# Patient Record
Sex: Female | Born: 1950
Health system: Southern US, Community
[De-identification: ages and names within clinical notes are randomized; demographics above are authoritative.]

## PROBLEM LIST (undated history)

## (undated) DIAGNOSIS — F419 Anxiety disorder, unspecified: Secondary | ICD-10-CM

## (undated) DIAGNOSIS — F439 Reaction to severe stress, unspecified: Secondary | ICD-10-CM

## (undated) DIAGNOSIS — M1990 Unspecified osteoarthritis, unspecified site: Secondary | ICD-10-CM

## (undated) DIAGNOSIS — U071 COVID-19: Secondary | ICD-10-CM

## (undated) DIAGNOSIS — E559 Vitamin D deficiency, unspecified: Secondary | ICD-10-CM

## (undated) DIAGNOSIS — R519 Headache, unspecified: Secondary | ICD-10-CM

## (undated) DIAGNOSIS — E538 Deficiency of other specified B group vitamins: Secondary | ICD-10-CM

## (undated) DIAGNOSIS — K602 Anal fissure, unspecified: Secondary | ICD-10-CM

## (undated) DIAGNOSIS — M7060 Trochanteric bursitis, unspecified hip: Secondary | ICD-10-CM

## (undated) DIAGNOSIS — J309 Allergic rhinitis, unspecified: Secondary | ICD-10-CM

## (undated) DIAGNOSIS — R51 Headache: Secondary | ICD-10-CM

## (undated) DIAGNOSIS — E785 Hyperlipidemia, unspecified: Secondary | ICD-10-CM

## (undated) HISTORY — DX: Anal fissure, unspecified: K60.2

## (undated) HISTORY — PX: ANAL FISSURE REPAIR: SHX2312

## (undated) HISTORY — DX: Hyperlipidemia, unspecified: E78.5

## (undated) HISTORY — PX: CATARACT EXTRACTION: SUR2

## (undated) HISTORY — DX: COVID-19: U07.1

## (undated) HISTORY — DX: Allergic rhinitis, unspecified: J30.9

## (undated) HISTORY — DX: Reaction to severe stress, unspecified: F43.9

## (undated) HISTORY — PX: JOINT REPLACEMENT: SHX530

## (undated) HISTORY — DX: Deficiency of other specified B group vitamins: E53.8

## (undated) HISTORY — DX: Trochanteric bursitis, unspecified hip: M70.60

## (undated) HISTORY — DX: Vitamin D deficiency, unspecified: E55.9

## (undated) HISTORY — PX: CERVICAL CERCLAGE: SHX1329

---

## 2011-09-03 LAB — HM HEPATITIS C SCREENING LAB: HM Hepatitis Screen: NEGATIVE

## 2014-08-12 ENCOUNTER — Ambulatory Visit: Payer: Self-pay | Admitting: General Practice

## 2014-08-12 LAB — BASIC METABOLIC PANEL
Anion Gap: 8 (ref 7–16)
BUN: 22 mg/dL — ABNORMAL HIGH
CALCIUM: 10.1 mg/dL
Chloride: 105 mmol/L
Co2: 27 mmol/L
Creatinine: 0.73 mg/dL
EGFR (African American): >60
EGFR (Non-African Amer.): >60
Glucose: 88 mg/dL
Potassium: 4.3 mmol/L
SODIUM: 140 mmol/L

## 2014-08-12 LAB — MRSA PCR SCREENING

## 2014-08-12 LAB — URINALYSIS, COMPLETE
Bacteria: NONE SEEN
Bilirubin,UR: NEGATIVE
Blood: NEGATIVE
Glucose,UR: NEGATIVE mg/dL (ref 0–75)
Ketone: NEGATIVE
LEUKOCYTE ESTERASE: NEGATIVE
NITRITE: NEGATIVE
PROTEIN: NEGATIVE
Ph: 6 (ref 4.5–8.0)
RBC, UR: NONE SEEN /HPF (ref 0–5)
Specific Gravity: 1.003 (ref 1.003–1.030)
Squamous Epithelial: NONE SEEN

## 2014-08-12 LAB — CBC
HCT: 39.2 % (ref 35.0–47.0)
HGB: 13.1 g/dL (ref 12.0–16.0)
MCH: 28.9 pg (ref 26.0–34.0)
MCHC: 33.4 g/dL (ref 32.0–36.0)
MCV: 87 fL (ref 80–100)
Platelet: 242 10*3/uL (ref 150–440)
RBC: 4.52 10*6/uL (ref 3.80–5.20)
RDW: 13.9 % (ref 11.5–14.5)
WBC: 8.1 10*3/uL (ref 3.6–11.0)

## 2014-08-12 LAB — APTT: Activated PTT: 27 secs (ref 23.6–35.9)

## 2014-08-12 LAB — PROTIME-INR
INR: 1
Prothrombin Time: 13.1 secs

## 2014-08-12 LAB — SEDIMENTATION RATE: Erythrocyte Sed Rate: 9 mm/hr (ref 0–30)

## 2014-08-13 LAB — URINE CULTURE

## 2014-08-24 ENCOUNTER — Inpatient Hospital Stay
Admit: 2014-08-24 | Disposition: A | Discharge status: Home / Self Care | Payer: Self-pay | Attending: General Practice | Admitting: General Practice

## 2014-08-24 HISTORY — PX: TOTAL HIP ARTHROPLASTY: SHX124

## 2014-08-25 LAB — BASIC METABOLIC PANEL
Anion Gap: 3 — ABNORMAL LOW (ref 7–16)
BUN: 8 mg/dL
CO2: 26 mmol/L
CREATININE: 0.6 mg/dL
Calcium, Total: 8.5 mg/dL — ABNORMAL LOW
Chloride: 104 mmol/L
EGFR (African American): >60
GLUCOSE: 131 mg/dL — AB
Potassium: 3.9 mmol/L
Sodium: 133 mmol/L — ABNORMAL LOW

## 2014-08-25 LAB — HEMOGLOBIN: HGB: 10.3 g/dL — AB (ref 12.0–16.0)

## 2014-08-25 LAB — PLATELET COUNT: Platelet: 207 10*3/uL (ref 150–440)

## 2014-08-26 LAB — BASIC METABOLIC PANEL
Anion Gap: 5 — ABNORMAL LOW (ref 7–16)
BUN: 7 mg/dL
Calcium, Total: 8.9 mg/dL
Chloride: 100 mmol/L — ABNORMAL LOW
Co2: 28 mmol/L
Creatinine: 0.63 mg/dL
EGFR (Non-African Amer.): >60
GLUCOSE: 108 mg/dL — AB
Potassium: 3.6 mmol/L
SODIUM: 133 mmol/L — AB

## 2014-08-26 LAB — PLATELET COUNT: Platelet: 193 10*3/uL (ref 150–440)

## 2014-08-26 LAB — HEMOGLOBIN: HGB: 9.8 g/dL — AB (ref 12.0–16.0)

## 2014-09-07 LAB — SURGICAL PATHOLOGY

## 2014-09-13 NOTE — Op Note (Signed)
PATIENT NAME:  Shannon [name], Shannon [name] MR#:  [id number] DATE OF BIRTH:  [birth year]  DATE OF PROCEDURE:  08/24/2014  PREOPERATIVE DIAGNOSIS: Degenerative arthrosis of the right hip (primary).   POSTOPERATIVE DIAGNOSIS: Degenerative arthrosis of the right hip (primary).   PROCEDURE PERFORMED: Right total hip arthroplasty.   SURGEON: Laurice Record. Holley Bouche., M.D.    ASSISTANT: Vance Peper, PA (required to maintain retraction throughout the procedure).   ANESTHESIA: Spinal.   ESTIMATED BLOOD LOSS: 250 mL.   FLUIDS REPLACED: 1600 mL of crystalloid.   DRAINS: Two medium drains to Hemovac reservoir.   IMPLANTS UTILIZED: DePuy 13.5 mm small stature AML femoral stem, 52 mm outer diameter Pinnacle 100 acetabular component, +4 mm neutral Pinnacle Marathon polyethylene liner, and a 36 mm M-SPEC femoral head with a +5 mm neck length.   INDICATIONS FOR SURGERY: The patient is a 64 year old female, who has been seen for complaints of progressive right hip and groin pain. X-rays demonstrated severe degenerative changes. After discussion of the risks and benefits of surgical intervention, the patient expressed understanding of the risks and benefits, and agreed with plans for surgical intervention.   PROCEDURE IN DETAIL: The patient was brought to the operating room, and after adequate spinal anesthesia was achieved, the patient was placed in a left lateral decubitus position. An extra roll was placed, and all bony prominences were well padded. The patient's right hip and leg were cleaned and prepped with alcohol and DuraPrep draped in the usual sterile fashion. A "timeout" was performed as per usual protocol. A lateral curvilinear incision was made gently curving towards the posterior superior iliac spine. The IT band was incised in line with the skin incision. Fibers of the gluteus maximus were split in line. The piriformis tendon was identified, skeletonized, incised at its insertion at the proximal femur and  reflected posteriorly. In a similar fashion, the short external rotators were incised and reflected posteriorly. A T-type posterior capsulotomy was performed. Prior to dislocation of the femoral head, a threaded Steinmann pin was inserted through a separate stab incision into the pelvis superior to the acetabulum and bent in the form of a stylus so as to assess limb length and hip offset throughout the procedure. The femoral head was then dislocated posteriorly. Inspection of the femoral head demonstrated severe degenerative changes with full-thickness loss of articular cartilage. The femoral neck cut was performed using an oscillating saw. The anterior capsule was elevated off the femoral neck. Inspection of the acetabulum also demonstrated significant degenerative changes. The labrum was excised using electrocautery. The acetabulum was reamed in a sequential fashion up to a 51 mm diameter. Good punctate bleeding bone was encountered. A 52 mm outer diameter Pinnacle 100 acetabular component was positioned and impacted into place. Good scratch fit was appreciated. Some anterior osteophytes were debrided using a combination of osteotome and a rongeur. A +4 mm neutral polyethylene trial was inserted and attention was directed to the proximal femur.   A pilot hole for reaming of the femoral canal was created using a high-speed bur. The proximal femoral canal was reamed in a sequential fashion up to a 13 mm diameter. This allowed for approximately 6 cm of scratch fit. A 13.5 mm aggressive side-biting reamer was then used to prepare the proximal femur. Serial broaches were inserted up to a 13.5 mm small stature broach. The calcar region was planed and trial reduction was performed using first a +1.5 mm neck length, and subsequently a +5 mm neck length. This allowed for  good equalization of limb lengths and hip offset, and excellent stability both anteriorly and posteriorly. Trial components were removed. The acetabular  shell was irrigated with copious amounts of normal saline with antibiotic solution and then suctioned dry. A +4 mm Pinnacle Marathon polyethylene insert was positioned and impacted into place. Next, a 13.5 mm AML small stature stem was positioned and impacted into place. Excellent scratch fit was appreciated. Trial reduction was performed using a 36 mm hip ball with a +5 mm neck length. This allowed for good equalization of limb lengths and hip offset and excellent stability both anteriorly and posteriorly. The trial hip ball was removed. The [facility] taper was cleaned and dried and a 36 mm outer diameter M-SPEC femoral head with a +5 mm neck length was placed on the trunnion and impacted into place. The hip was reduced and placed through a range of motion. Again, excellent stability both anteriorly and posteriorly was noted and good equalization of limb lengths and appropriate hip offset was noted.   The wound was irrigated with copious amounts of normal saline with antibiotic solution using pulsatile lavage and suctioned dry. Good hemostasis was appreciated. The posterior capsulotomy was repaired using #5 Ethibond. The piriformis tendon was reapproximated on the surface of the gluteus medius tendon using #5 Ethibond. Two medium drains were placed in the wound bed and brought out through a separate stab incision to be attached to a Hemovac reservoir. IT band was repaired using interrupted sutures of #1 Vicryl. The subcutaneous tissue was approximated in layers using first #0 Vicryl followed by #2-0 Vicryl. The skin was closed with skin staples. A sterile dressing was applied.   The patient tolerated the procedure well. She was transported to the recovery room in stable condition.    ____________________________ Laurice Record. Holley Bouche., MD jph:JT D: 08/24/2014 10:27:36 ET T: 08/24/2014 11:03:13 ET JOB#: [id number]  cc: Jeneen Rinks P. Holley Bouche., MD, <Dictator> JAMES P Holley Bouche MD ELECTRONICALLY SIGNED 09/10/2014  7:08

## 2014-09-13 NOTE — Discharge Summary (Signed)
PATIENT NAME:  Shannon [name], Shannon [name] MR#:  [id number] DATE OF BIRTH:  1951/04/06  DATE OF ADMISSION:  08/24/2014 DATE OF DISCHARGE:  08/27/2014  DICTATING FOR: Jeneen Rinks P. Holley Bouche., MD  ADMITTING DIAGNOSIS: Degenerative arthrosis of the right hip.   DISCHARGE DIAGNOSIS: Degenerative arthrosis of the right hip.   HISTORY:  The patient is a 64 year old female who has been followed at [facility] for progression of right hip and groin pain. She reported approximately a 4 to 5 year history of progressive hip pain. She had used nonsteroidal anti-inflammatory medication with only modest improvement in her symptoms. She has also completed a course of physical therapy, but still had severe right hip and groin pain. She was noted to have decrease in her right hip range of motion. She occasionally had sensation of the right hip giving way. At the time of surgery, she was not using any ambulatory aid on a regular basis. The patient states that the right hip pain had progressed to the point that it was significantly interfering with her activities of daily living. X-rays taken in [facility] showed significant narrowing of the cartilage space with flattening of the superior aspect of the femoral head with bone-on-bone articulation. There was subchondral sclerosis as well as osteophyte formation noted. After discussion of the risks and benefits of surgical intervention, the patient expressed her understanding of the risks and benefits and agreed for plans for surgical intervention.   PROCEDURE: Right total hip arthroplasty.   ANESTHESIA: Spinal.   IMPLANTS UTILIZED:  DePuy 13.5 mm small stature AML femoral stem, 52 mm outer diameter Pinnacle 100 acetabular component, +4 neutral Pinnacle Marathon polyethylene liner, and a 36 mm M-SPEC femoral head with a + 5 mm neck length.   HOSPITAL COURSE: The patient tolerated the procedure very well. She had no complications. She was then taken to the PACU where she was  stabilized and then transferred to the orthopedic floor. She began receiving anticoagulation therapy of Lovenox 30 mg subcutaneous every 12 hours per anesthesia and pharmacy protocol. She was fitted with TED stockings bilaterally. These were allowed to be removed 1 hour per 8 hour shift. The patient was also fitted with the AVI compression foot pumps bilaterally set at 80 mmHg. Her calves have been nontender. She has had no evidence of any DVTs. Negative Homans sign. Heels were elevated off the bed using rolled towels.   The patient's vital signs have been stable. She has been afebrile. Hemodynamically she was stable. No transfusions were given. She is denying any chest pain or shortness of breath.   Physical therapy was initiated on day 1 for gait training and transfers. She has done extremely well. Upon being discharged, was ambulating greater than 350 feet. She was able go up 4 steps. She was independent with bed to chair transfers safely. She also could recall her hip precautions.   The patient's IV, Foley and Hemovac were discontinued on day 2, the Hemovac was also discontinued. The wound was free of any drainage or signs of infection.   DISPOSITION: The patient is discharged to home in improved stable condition.   DISCHARGE INSTRUCTIONS: She may continue weight-bearing as tolerated. Continue using a rolling walker until cleared by physical therapy to go to a quad cane. She will receive home health PT. She was instructed in elevation of the lower extremity. Continue TED stockings bilaterally. These are to be worn during the day, but may be removed at night. I recommend she continue with incentive spirometer  q. 1 hour while awake. I also encouraged cough, deep breathing q. 2 hours while awake. She is placed on a regular diet. She was instructed on wound care. Staples will be removed on 92/92/4462 with the application of benzoin and Steri-Strips. She has a followup appointment with [facility] on  10/06/2014 at 9:15. She is to call the clinic sooner if any temperatures of 101.5 or greater, excessive bleeding, or any other complications.   The patient may resume her regular medication that she was on prior to admission. She was given a prescription for Lovenox 40 mg subcutaneously daily for 14 days, then discontinue and begin taking one 81 mg enteric-coated aspirin. Also a prescription for Roxicodone 5 to 10 mg q. 4-6 hours p.r.n. for pain and tramadol 50 to 100 mg q. 4-6 hours p.r.n. for pain.   PAST MEDICAL HISTORY:  Depression and acid reflux.   ____________________________ Vance Peper, PA [phone] D: 08/27/2014 06:34:48 ET T: 08/27/2014 07:56:53 ET JOB#: [id number]  cc: Vance Peper, PA, <Dictator> Maryssa Giampietro PA ELECTRONICALLY SIGNED 08/27/2014 22:33

## 2015-01-20 ENCOUNTER — Other Ambulatory Visit: Payer: Self-pay | Admitting: Family Medicine

## 2015-01-20 DIAGNOSIS — Z1231 Encounter for screening mammogram for malignant neoplasm of breast: Secondary | ICD-10-CM

## 2015-01-26 ENCOUNTER — Ambulatory Visit
Admission: RE | Admit: 2015-01-26 | Discharge: 2015-01-26 | Disposition: A | Discharge status: Home / Self Care | Payer: No Typology Code available for payment source | Source: Ambulatory Visit | Attending: Family Medicine | Admitting: Family Medicine

## 2015-01-26 DIAGNOSIS — Z1231 Encounter for screening mammogram for malignant neoplasm of breast: Secondary | ICD-10-CM | POA: Diagnosis not present

## 2015-07-29 ENCOUNTER — Emergency Department (HOSPITAL_COMMUNITY)
Admission: EM | Admit: 2015-07-29 | Discharge: 2015-07-29 | Disposition: A | Discharge status: Home / Self Care | Payer: Self-pay | Attending: Emergency Medicine | Admitting: Emergency Medicine

## 2015-07-29 ENCOUNTER — Encounter (HOSPITAL_COMMUNITY): Payer: Self-pay

## 2015-07-29 ENCOUNTER — Emergency Department (HOSPITAL_COMMUNITY): Payer: Self-pay

## 2015-07-29 DIAGNOSIS — Y9389 Activity, other specified: Secondary | ICD-10-CM | POA: Insufficient documentation

## 2015-07-29 DIAGNOSIS — R4182 Altered mental status, unspecified: Secondary | ICD-10-CM | POA: Insufficient documentation

## 2015-07-29 DIAGNOSIS — Z8739 Personal history of other diseases of the musculoskeletal system and connective tissue: Secondary | ICD-10-CM | POA: Insufficient documentation

## 2015-07-29 DIAGNOSIS — Y998 Other external cause status: Secondary | ICD-10-CM | POA: Insufficient documentation

## 2015-07-29 DIAGNOSIS — Z87891 Personal history of nicotine dependence: Secondary | ICD-10-CM | POA: Insufficient documentation

## 2015-07-29 DIAGNOSIS — W010XXA Fall on same level from slipping, tripping and stumbling without subsequent striking against object, initial encounter: Secondary | ICD-10-CM | POA: Insufficient documentation

## 2015-07-29 DIAGNOSIS — S0083XA Contusion of other part of head, initial encounter: Secondary | ICD-10-CM | POA: Insufficient documentation

## 2015-07-29 DIAGNOSIS — S060X1A Concussion with loss of consciousness of 30 minutes or less, initial encounter: Secondary | ICD-10-CM | POA: Insufficient documentation

## 2015-07-29 DIAGNOSIS — Y92007 Garden or yard of unspecified non-institutional (private) residence as the place of occurrence of the external cause: Secondary | ICD-10-CM | POA: Insufficient documentation

## 2015-07-29 LAB — CBC WITH DIFFERENTIAL/PLATELET
Basophils Absolute: 0 10*3/uL (ref 0.0–0.1)
Basophils Relative: 0 %
Eosinophils Absolute: 0.2 10*3/uL (ref 0.0–0.7)
Eosinophils Relative: 2 %
HEMATOCRIT: 38.9 % (ref 36.0–46.0)
HEMOGLOBIN: 13.2 g/dL (ref 12.0–15.0)
LYMPHS PCT: 22 %
Lymphs Abs: 1.8 10*3/uL (ref 0.7–4.0)
MCH: 28.5 pg (ref 26.0–34.0)
MCHC: 33.9 g/dL (ref 30.0–36.0)
MCV: 84 fL (ref 78.0–100.0)
MONOS PCT: 4 %
Monocytes Absolute: 0.3 10*3/uL (ref 0.1–1.0)
NEUTROS ABS: 5.7 10*3/uL (ref 1.7–7.7)
NEUTROS PCT: 72 %
Platelets: 261 10*3/uL (ref 150–400)
RBC: 4.63 MIL/uL (ref 3.87–5.11)
RDW: 14 % (ref 11.5–15.5)
WBC: 8 10*3/uL (ref 4.0–10.5)

## 2015-07-29 LAB — COMPREHENSIVE METABOLIC PANEL
ALK PHOS: 79 U/L (ref 38–126)
ALT: 18 U/L (ref 14–54)
ANION GAP: 10 (ref 5–15)
AST: 24 U/L (ref 15–41)
Albumin: 4.1 g/dL (ref 3.5–5.0)
BILIRUBIN TOTAL: 0.5 mg/dL (ref 0.3–1.2)
BUN: 14 mg/dL (ref 6–20)
CALCIUM: 10.6 mg/dL — AB (ref 8.9–10.3)
CO2: 28 mmol/L (ref 22–32)
Chloride: 104 mmol/L (ref 101–111)
Creatinine, Ser: 0.78 mg/dL (ref 0.44–1.00)
Glucose, Bld: 109 mg/dL — ABNORMAL HIGH (ref 65–99)
Potassium: 5.1 mmol/L (ref 3.5–5.1)
Sodium: 142 mmol/L (ref 135–145)
TOTAL PROTEIN: 6.9 g/dL (ref 6.5–8.1)

## 2015-07-29 MED ORDER — ACETAMINOPHEN 325 MG PO TABS
650.0000 mg | ORAL_TABLET | Freq: Once | ORAL | Status: AC
  Administered 2015-07-29: 650 mg via ORAL
  Filled 2015-07-29: qty 2

## 2015-07-29 NOTE — Discharge Instructions (Signed)
Concussion, Adult  A concussion, or closed-head injury, is a brain injury caused by a direct blow to the head or by a quick and sudden movement (jolt) of the head or neck. Concussions are usually not life-threatening. Even so, the effects of a concussion can be serious. If you have had a concussion before, you are more likely to experience concussion-like symptoms after a direct blow to the head.   CAUSES  · Direct blow to the head, such as from running into another player during a soccer game, being hit in a fight, or hitting your head on a hard surface.  · A jolt of the head or neck that causes the brain to move back and forth inside the skull, such as in a car crash.  SIGNS AND SYMPTOMS  The signs of a concussion can be hard to notice. Early on, they may be missed by you, family members, and health care providers. You may look fine but act or feel differently.  Symptoms are usually temporary, but they may last for days, weeks, or even longer. Some symptoms may appear right away while others may not show up for hours or days. Every head injury is different. Symptoms include:  · Mild to moderate headaches that will not go away.  · A feeling of pressure inside your head.  · Having more trouble than usual:    Learning or remembering things you have heard.    Answering questions.    Paying attention or concentrating.    Organizing daily tasks.    Making decisions and solving problems.  · Slowness in thinking, acting or reacting, speaking, or reading.  · Getting lost or being easily confused.  · Feeling tired all the time or lacking energy (fatigued).  · Feeling drowsy.  · Sleep disturbances.    Sleeping more than usual.    Sleeping less than usual.    Trouble falling asleep.    Trouble sleeping (insomnia).  · Loss of balance or feeling lightheaded or dizzy.  · Nausea or vomiting.  · Numbness or tingling.  · Increased sensitivity to:    Sounds.    Lights.    Distractions.  · Vision problems or eyes that tire  easily.  · Diminished sense of taste or smell.  · Ringing in the ears.  · Mood changes such as feeling sad or anxious.  · Becoming easily irritated or angry for little or no reason.  · Lack of motivation.  · Seeing or hearing things other people do not see or hear (hallucinations).  DIAGNOSIS  Your health care provider can usually diagnose a concussion based on a description of your injury and symptoms. He or she will ask whether you passed out (lost consciousness) and whether you are having trouble remembering events that happened right before and during your injury.  Your evaluation might include:  · A brain scan to look for signs of injury to the brain. Even if the test shows no injury, you may still have a concussion.  · Blood tests to be sure other problems are not present.  TREATMENT  · Concussions are usually treated in an emergency department, in urgent care, or at a clinic. You may need to stay in the hospital overnight for further treatment.  · Tell your health care provider if you are taking any medicines, including prescription medicines, over-the-counter medicines, and natural remedies. Some medicines, such as blood thinners (anticoagulants) and aspirin, may increase the chance of complications. Also tell your health care   provider whether you have had alcohol or are taking illegal drugs. This information may affect treatment.  · Your health care provider will send you home with important instructions to follow.  · How fast you will recover from a concussion depends on many factors. These factors include how severe your concussion is, what part of your brain was injured, your age, and how healthy you were before the concussion.  · Most people with mild injuries recover fully. Recovery can take time. In general, recovery is slower in older persons. Also, persons who have had a concussion in the past or have other medical problems may find that it takes longer to recover from their current injury.  HOME  CARE INSTRUCTIONS  General Instructions  · Carefully follow the directions your health care provider gave you.  · Only take over-the-counter or prescription medicines for pain, discomfort, or fever as directed by your health care provider.  · Take only those medicines that your health care provider has approved.  · Do not drink alcohol until your health care provider says you are well enough to do so. Alcohol and certain other drugs may slow your recovery and can put you at risk of further injury.  · If it is harder than usual to remember things, write them down.  · If you are easily distracted, try to do one thing at a time. For example, do not try to watch TV while fixing dinner.  · Talk with family members or close friends when making important decisions.  · Keep all follow-up appointments. Repeated evaluation of your symptoms is recommended for your recovery.  · Watch your symptoms and tell others to do the same. Complications sometimes occur after a concussion. Older adults with a brain injury may have a higher risk of serious complications, such as a blood clot on the brain.  · Tell your teachers, school nurse, school counselor, coach, athletic trainer, or work manager about your injury, symptoms, and restrictions. Tell them about what you can or cannot do. They should watch for:    Increased problems with attention or concentration.    Increased difficulty remembering or learning new information.    Increased time needed to complete tasks or assignments.    Increased irritability or decreased ability to cope with stress.    Increased symptoms.  · Rest. Rest helps the brain to heal. Make sure you:    Get plenty of sleep at night. Avoid staying up late at night.    Keep the same bedtime hours on weekends and weekdays.    Rest during the day. Take daytime naps or rest breaks when you feel tired.  · Limit activities that require a lot of thought or concentration. These include:    Doing homework or job-related  work.    Watching TV.    Working on the computer.  · Avoid any situation where there is potential for another head injury (football, hockey, soccer, basketball, martial arts, downhill snow sports and horseback riding). Your condition will get worse every time you experience a concussion. You should avoid these activities until you are evaluated by the appropriate follow-up health care providers.  Returning To Your Regular Activities  You will need to return to your normal activities slowly, not all at once. You must give your body and brain enough time for recovery.  · Do not return to sports or other athletic activities until your health care provider tells you it is safe to do so.  · Ask   your health care provider when you can drive, ride a bicycle, or operate heavy machinery. Your ability to react may be slower after a brain injury. Never do these activities if you are dizzy.  · Ask your health care provider about when you can return to work or school.  Preventing Another Concussion  It is very important to avoid another brain injury, especially before you have recovered. In rare cases, another injury can lead to permanent brain damage, brain swelling, or death. The risk of this is greatest during the first 7-10 days after a head injury. Avoid injuries by:  · Wearing a seat belt when riding in a car.  · Drinking alcohol only in moderation.  · Wearing a helmet when biking, skiing, skateboarding, skating, or doing similar activities.  · Avoiding activities that could lead to a second concussion, such as contact or recreational sports, until your health care provider says it is okay.  · Taking safety measures in your home.    Remove clutter and tripping hazards from floors and stairways.    Use grab bars in bathrooms and handrails by stairs.    Place non-slip mats on floors and in bathtubs.    Improve lighting in dim areas.  SEEK MEDICAL CARE IF:  · You have increased problems paying attention or  concentrating.  · You have increased difficulty remembering or learning new information.  · You need more time to complete tasks or assignments than before.  · You have increased irritability or decreased ability to cope with stress.  · You have more symptoms than before.  Seek medical care if you have any of the following symptoms for more than 2 weeks after your injury:  · Lasting (chronic) headaches.  · Dizziness or balance problems.  · Nausea.  · Vision problems.  · Increased sensitivity to noise or light.  · Depression or mood swings.  · Anxiety or irritability.  · Memory problems.  · Difficulty concentrating or paying attention.  · Sleep problems.  · Feeling tired all the time.  SEEK IMMEDIATE MEDICAL CARE IF:  · You have severe or worsening headaches. These may be a sign of a blood clot in the brain.  · You have weakness (even if only in one hand, leg, or part of the face).  · You have numbness.  · You have decreased coordination.  · You vomit repeatedly.  · You have increased sleepiness.  · One pupil is larger than the other.  · You have convulsions.  · You have slurred speech.  · You have increased confusion. This may be a sign of a blood clot in the brain.  · You have increased restlessness, agitation, or irritability.  · You are unable to recognize people or places.  · You have neck pain.  · It is difficult to wake you up.  · You have unusual behavior changes.  · You lose consciousness.  MAKE SURE YOU:  · Understand these instructions.  · Will watch your condition.  · Will get help right away if you are not doing well or get worse.     This information is not intended to replace advice given to you by your health care provider. Make sure you discuss any questions you have with your health care provider.     Document Released: 07/22/2003 Document Revised: 05/22/2014 Document Reviewed: 11/21/2012  [employer] Interactive Patient Education ©2016 [employer]

## 2015-07-29 NOTE — ED Provider Notes (Addendum)
CSN: [id number]     Arrival date & time 07/29/15  1152 History   First MD Initiated Contact with Patient 07/29/15 1154     Chief Complaint  Patient presents with  . Fall  . Altered Mental Status     (Consider location/radiation/quality/duration/timing/severity/associated sxs/prior Treatment) HPI Comments: Patient is a 65 year old female with a history of bilateral hip arthritis who presents today with loss of consciousness. Patient states the last thing she remembers is taking the phone to her boyfriend and then waking up with EMS. EMS reported patient tripped over a branch in the yard and fell face first onto the concrete. Initially patient was only oriented to self however upon arrival here she is awake alert and oriented. She is complaining of right-sided facial pain but denies any unilateral weakness, numbness, swallowing or speech difficulty. She takes no anticoagulation and denies any recent illness or medication changes.  Patient is a 65 y.o. female presenting with fall and altered mental status. The history is provided by the patient and the EMS personnel.  Fall  Altered Mental Status   History reviewed. No pertinent past medical history. Past Surgical History  Procedure Laterality Date  . Total hip arthroplasty     History reviewed. No pertinent family history. Social History  Substance Use Topics  . Smoking status: Former Research scientist (life sciences)  . Smokeless tobacco: None  . Alcohol Use: No   OB History    No data available     Review of Systems  All other systems reviewed and are negative.     Allergies  Review of patient's allergies indicates no known allergies.  Home Medications   Prior to Admission medications   Not on File   BP 145/87 mmHg  Pulse 61  Temp(Src) 97.9 F (36.6 C) (Oral)  Ht 5\' 5"  (1.651 m)  Wt 165 lb (74.844 kg)  BMI 27.46 kg/m2  SpO2 100% Physical Exam  Constitutional: She is oriented to person, place, and time. She appears well-developed and  well-nourished. No distress.  HENT:  Head: Normocephalic and atraumatic.    Mouth/Throat: Oropharynx is clear and moist.  Eyes: Conjunctivae and EOM are normal. Pupils are equal, round, and reactive to light.  Neck: Normal range of motion. Neck supple. No spinous process tenderness and no muscular tenderness present.  Cardiovascular: Normal rate, regular rhythm and intact distal pulses.   No murmur heard. Pulmonary/Chest: Effort normal and breath sounds normal. No respiratory distress. She has no wheezes. She has no rales.  Abdominal: Soft. She exhibits no distension. There is no tenderness. There is no rebound and no guarding.  Musculoskeletal: Normal range of motion. She exhibits no edema or tenderness.  Neurological: She is alert and oriented to person, place, and time. She has normal strength. No cranial nerve deficit or sensory deficit. Coordination and gait normal.  No pronator drift present  Skin: Skin is warm and dry. No rash noted. No erythema.  Psychiatric: She has a normal mood and affect. Her behavior is normal.  Nursing note and vitals reviewed.   ED Course  Procedures (including critical care time) Labs Review Labs Reviewed  COMPREHENSIVE METABOLIC PANEL - Abnormal; Notable for the following:    Glucose, Bld 109 (*)    Calcium 10.6 (*)    All other components within normal limits  CBC WITH DIFFERENTIAL/PLATELET  URINALYSIS, ROUTINE W REFLEX MICROSCOPIC (NOT AT [facility])    Imaging Review Dg Chest 2 View  07/29/2015  CLINICAL DATA:  [place] today and hit head. EXAM: CHEST  2 VIEW COMPARISON:  None. FINDINGS: The cardiac silhouette, mediastinal and hilar contours are within normal limits. Mild tortuosity of the thoracic aorta. The lungs are clear. No pleural effusion. No pneumothorax. Bilateral nipple shadows noted. The bony thorax is intact. IMPRESSION: No acute cardiopulmonary findings and intact bony thorax. Electronically Signed   By: Marijo Sanes M.D.   On: 07/29/2015  13:22   Ct Head Wo Contrast  07/29/2015  CLINICAL DATA:  Syncope. Fall. Swelling and abrasion to right face and below chin. EXAM: CT HEAD WITHOUT CONTRAST CT MAXILLOFACIAL WITHOUT CONTRAST TECHNIQUE: Multidetector CT imaging of the head and maxillofacial structures were performed using the standard protocol without intravenous contrast. Multiplanar CT image reconstructions of the maxillofacial structures were also generated. COMPARISON:  None. FINDINGS: CT HEAD FINDINGS Paranasal sinuses, mastoid air cells, and visualized bones are within normal limits. No subdural, epidural, or subarachnoid hemorrhage. No mass, mass effect, or midline shift. Ventricles and sulci are normal. No acute ischemia or infarct identified. Cerebellum, brainstem, and basal cisterns are normal. CT MAXILLOFACIAL FINDINGS There is soft tissue swelling inferior to the mandible, particularly to the right. There is also soft tissue swelling over the right cheek. Extracranial soft tissues including the orbits are otherwise normal. No fractures in the facial bones. IMPRESSION: 1. No acute intracranial process. 2. No fractures. Electronically Signed   By: Dorise Bullion III M.D   On: 07/29/2015 13:37   Ct Maxillofacial Wo Cm  07/29/2015  CLINICAL DATA:  Syncope. Fall. Swelling and abrasion to right face and below chin. EXAM: CT HEAD WITHOUT CONTRAST CT MAXILLOFACIAL WITHOUT CONTRAST TECHNIQUE: Multidetector CT imaging of the head and maxillofacial structures were performed using the standard protocol without intravenous contrast. Multiplanar CT image reconstructions of the maxillofacial structures were also generated. COMPARISON:  None. FINDINGS: CT HEAD FINDINGS Paranasal sinuses, mastoid air cells, and visualized bones are within normal limits. No subdural, epidural, or subarachnoid hemorrhage. No mass, mass effect, or midline shift. Ventricles and sulci are normal. No acute ischemia or infarct identified. Cerebellum, brainstem, and basal  cisterns are normal. CT MAXILLOFACIAL FINDINGS There is soft tissue swelling inferior to the mandible, particularly to the right. There is also soft tissue swelling over the right cheek. Extracranial soft tissues including the orbits are otherwise normal. No fractures in the facial bones. IMPRESSION: 1. No acute intracranial process. 2. No fractures. Electronically Signed   By: Dorise Bullion III M.D   On: 07/29/2015 13:37   I have personally reviewed and evaluated these images and lab results as part of my medical decision-making.   EKG Interpretation None      MDM   Final diagnoses:  Concussion, with loss of consciousness of 30 minutes or less, initial encounter  Facial contusion, initial encounter    Patient is a healthy 65 year old female who had a fall today with a syncopal event. It is unclear if patient syncopized prior to or after the fall. EMS report patient tripped over branches in the [place] fell face first onto the concrete however patient cannot remember anything. Currently her only complaint is pain over the right side of her face. No localized weakness, numbness or speech difficulty. Currently family has not present to give further history. Patient has never had a history of syncope and denies any palpitations, chest pain or shortness of breath. She takes no anticoagulation.  CT of the head, face, EKG, CBC, CMP, UA pending. Will wait for family to arrive for more information.  2:03 PM Patient's labs, EKG are  within normal limits. Head CT, facial CT and chest x-ray without acute findings. patient's boyfriend arrives he states that she tripped on abdominal wall and fell face first into the concrete.  Pt never was completely unconscious but was confused per family.  Pt does not remember this.  Symptoms she is displaying are consistent with concussion. Patient was able to ambulate without difficulty and feel that she is safe for discharge home.  Blanchie Dessert, MD 07/29/15  1404  Blanchie Dessert, MD 07/29/15 1422

## 2015-07-29 NOTE — ED Notes (Signed)
Pt tripped over branches in yard and fell face first onto concrete.  EMS reports that pt did not attempt to catch herself and fell on right side of face.  Obvious swelling and bruising noted.  Upon EMS arrival pt was only oriented to self.  Pt is A&Ox4 upon arrival and in NAD.

## 2016-07-11 ENCOUNTER — Other Ambulatory Visit: Payer: Self-pay | Admitting: Family Medicine

## 2016-07-11 DIAGNOSIS — Z1231 Encounter for screening mammogram for malignant neoplasm of breast: Secondary | ICD-10-CM

## 2016-08-08 ENCOUNTER — Ambulatory Visit
Admission: RE | Admit: 2016-08-08 | Discharge: 2016-08-08 | Disposition: A | Discharge status: Home / Self Care | Payer: BLUE CROSS/BLUE SHIELD | Source: Ambulatory Visit | Attending: Family Medicine | Admitting: Family Medicine

## 2016-08-08 DIAGNOSIS — Z1231 Encounter for screening mammogram for malignant neoplasm of breast: Secondary | ICD-10-CM | POA: Diagnosis present

## 2016-09-14 ENCOUNTER — Encounter
Admission: RE | Admit: 2016-09-14 | Discharge: 2016-09-14 | Disposition: A | Discharge status: Home / Self Care | Payer: BLUE CROSS/BLUE SHIELD | Source: Ambulatory Visit | Attending: Orthopedic Surgery | Admitting: Orthopedic Surgery

## 2016-09-14 DIAGNOSIS — Z01812 Encounter for preprocedural laboratory examination: Secondary | ICD-10-CM | POA: Insufficient documentation

## 2016-09-14 DIAGNOSIS — R51 Headache: Secondary | ICD-10-CM | POA: Insufficient documentation

## 2016-09-14 DIAGNOSIS — M1990 Unspecified osteoarthritis, unspecified site: Secondary | ICD-10-CM | POA: Diagnosis not present

## 2016-09-14 DIAGNOSIS — I517 Cardiomegaly: Secondary | ICD-10-CM | POA: Diagnosis not present

## 2016-09-14 DIAGNOSIS — Z01810 Encounter for preprocedural cardiovascular examination: Secondary | ICD-10-CM | POA: Insufficient documentation

## 2016-09-14 DIAGNOSIS — F419 Anxiety disorder, unspecified: Secondary | ICD-10-CM | POA: Diagnosis not present

## 2016-09-14 HISTORY — DX: Anxiety disorder, unspecified: F41.9

## 2016-09-14 HISTORY — DX: Headache: R51

## 2016-09-14 HISTORY — DX: Unspecified osteoarthritis, unspecified site: M19.90

## 2016-09-14 HISTORY — DX: Headache, unspecified: R51.9

## 2016-09-14 LAB — COMPREHENSIVE METABOLIC PANEL
ALK PHOS: 89 U/L (ref 38–126)
ALT: 31 U/L (ref 14–54)
AST: 30 U/L (ref 15–41)
Albumin: 4.4 g/dL (ref 3.5–5.0)
Anion gap: 7 (ref 5–15)
BILIRUBIN TOTAL: 0.5 mg/dL (ref 0.3–1.2)
BUN: 18 mg/dL (ref 6–20)
CALCIUM: 10 mg/dL (ref 8.9–10.3)
CO2: 29 mmol/L (ref 22–32)
Chloride: 103 mmol/L (ref 101–111)
Creatinine, Ser: 0.84 mg/dL (ref 0.44–1.00)
GFR calc Af Amer: >60 mL/min (ref >60)
Glucose, Bld: 89 mg/dL (ref 65–99)
POTASSIUM: 3.9 mmol/L (ref 3.5–5.1)
Sodium: 139 mmol/L (ref 135–145)
TOTAL PROTEIN: 7.7 g/dL (ref 6.5–8.1)

## 2016-09-14 LAB — TYPE AND SCREEN
ABO/RH(D): O POS
Antibody Screen: NEGATIVE

## 2016-09-14 LAB — CBC WITH DIFFERENTIAL/PLATELET
BASOS ABS: 0.1 10*3/uL (ref 0–0.1)
Basophils Relative: 1 %
Eosinophils Absolute: 0.3 10*3/uL (ref 0–0.7)
Eosinophils Relative: 4 %
HEMATOCRIT: 39.6 % (ref 35.0–47.0)
Hemoglobin: 13.3 g/dL (ref 12.0–16.0)
Lymphocytes Relative: 30 %
Lymphs Abs: 2.3 10*3/uL (ref 1.0–3.6)
MCH: 28.2 pg (ref 26.0–34.0)
MCHC: 33.5 g/dL (ref 32.0–36.0)
MCV: 84 fL (ref 80.0–100.0)
MONO ABS: 0.5 10*3/uL (ref 0.2–0.9)
Monocytes Relative: 6 %
Neutro Abs: 4.5 10*3/uL (ref 1.4–6.5)
Neutrophils Relative %: 59 %
Platelets: 295 10*3/uL (ref 150–440)
RBC: 4.71 MIL/uL (ref 3.80–5.20)
RDW: 15.2 % — AB (ref 11.5–14.5)
WBC: 7.7 10*3/uL (ref 3.6–11.0)

## 2016-09-14 LAB — PROTIME-INR
INR: 0.83
Prothrombin Time: 11.4 seconds (ref 11.4–15.2)

## 2016-09-14 LAB — APTT: APTT: 26 s (ref 24–36)

## 2016-09-14 LAB — URINALYSIS, ROUTINE W REFLEX MICROSCOPIC
Bilirubin Urine: NEGATIVE
Glucose, UA: NEGATIVE mg/dL
HGB URINE DIPSTICK: NEGATIVE
Ketones, ur: NEGATIVE mg/dL
LEUKOCYTES UA: NEGATIVE
NITRITE: NEGATIVE
PROTEIN: NEGATIVE mg/dL
SPECIFIC GRAVITY, URINE: 1.006 (ref 1.005–1.030)
pH: 6 (ref 5.0–8.0)

## 2016-09-14 LAB — SEDIMENTATION RATE: Sed Rate: 12 mm/hr (ref 0–30)

## 2016-09-14 LAB — C-REACTIVE PROTEIN

## 2016-09-14 LAB — SURGICAL PCR SCREEN
MRSA, PCR: NEGATIVE
Staphylococcus aureus: NEGATIVE

## 2016-09-14 NOTE — Patient Instructions (Signed)
  Your procedure is scheduled on:Sep 25, 2016 (Monday) Report to Same Day Surgery 2nd floor medical mall [facility] Entrance-take elevator on left to 2nd floor.  Check in with surgery information desk.) To find out your arrival time please call [phone] between 1PM - 3PM on Sep 22, 2016 (Friday)  Remember: Instructions that are not followed completely may result in serious medical risk, up to and including death, or upon the discretion of your surgeon and anesthesiologist your surgery may need to be rescheduled.    _x___ 1. Do not eat food or drink liquids after midnight. No gum chewing or hard candies                                 __x__ 2. No Alcohol for 24 hours before or after surgery.   __x__3. No Smoking for 24 prior to surgery.   ____  4. Bring all medications with you on the day of surgery if instructed.    __x__ 5. Notify your doctor if there is any change in your medical condition     (cold, fever, infections).     Do not wear jewelry, make-up, hairpins, clips or nail polish.  Do not wear lotions, powders, or perfumes.   Do not shave 48 hours prior to surgery. Men may shave face and neck.  Do not bring valuables to the hospital.    [facility] is not responsible for any belongings or valuables.               Contacts, dentures or bridgework may not be worn into surgery.  Leave your suitcase in the car. After surgery it may be brought to your room.  For patients admitted to the hospital, discharge time is determined by your treatment team                        Patients discharged the day of surgery will not be allowed to drive home.  You will need someone to drive you home and stay with you the night of your procedure.    Please read over the following fact sheets that you were given:   [facility] Preparing for Surgery and or MRSA Information   ___ Take anti-hypertensive (unless it includes a diuretic), cardiac, seizure, asthma,     anti-reflux and psychiatric  medicines. These include:  1.   2.  3.  4.  5.  6.  ____Fleets enema or Magnesium Citrate as directed.   _x___ Use CHG Soap or sage wipes as directed on instruction sheet   ____ Use inhalers on the day of surgery and bring to hospital day of surgery  ____ Stop Metformin and Janumet 2 days prior to surgery.    ____ Take 1/2 of usual insulin dose the night before surgery and none on the morning     surgery.   _x___ Follow recommendations from Cardiologist, Pulmonologist or PCP regarding          stopping Aspirin, Coumadin, Pllavix ,Eliquis, Effient, or Pradaxa, and Pletal.  X____Stop Anti-inflammatories such as Advil, Aleve, Ibuprofen, Motrin, Naproxen, Naprosyn, Goodies powders or aspirin products. OK to take Tylenol . (STOP DICLOFENAC ONE WEEK BEFORE SURGERY)   _x___ Stop supplements until after surgery.  But may continue Vitamin D, Vitamin B, and multivitamin. (STOP GLUCOSAMINE AND MELATONIN NOW)      ____ Bring C-Pap to the hospital.

## 2016-09-15 LAB — URINE CULTURE: Culture: <10000 — AB

## 2016-09-24 MED ORDER — TRANEXAMIC ACID 1000 MG/10ML IV SOLN
1000.0000 mg | INTRAVENOUS | Status: DC
  Filled 2016-09-24 (×2): qty 10

## 2016-09-24 MED ORDER — CEFAZOLIN SODIUM-DEXTROSE 2-4 GM/100ML-% IV SOLN
2.0000 g | INTRAVENOUS | Status: AC
  Administered 2016-09-25: 2 g via INTRAVENOUS

## 2016-09-25 ENCOUNTER — Encounter: Payer: Self-pay | Admitting: *Deleted

## 2016-09-25 ENCOUNTER — Inpatient Hospital Stay: Payer: BLUE CROSS/BLUE SHIELD

## 2016-09-25 ENCOUNTER — Inpatient Hospital Stay: Payer: BLUE CROSS/BLUE SHIELD | Admitting: Anesthesiology

## 2016-09-25 ENCOUNTER — Inpatient Hospital Stay
Admission: RE | Admit: 2016-09-25 | Discharge: 2016-09-27 | DRG: 470 | Disposition: A | Discharge status: Home Health | Payer: BLUE CROSS/BLUE SHIELD | Source: Ambulatory Visit | Attending: Orthopedic Surgery | Admitting: Orthopedic Surgery

## 2016-09-25 ENCOUNTER — Encounter
Admission: RE | Disposition: A | Discharge status: Home Health | Payer: Self-pay | Source: Ambulatory Visit | Attending: Orthopedic Surgery

## 2016-09-25 DIAGNOSIS — M1612 Unilateral primary osteoarthritis, left hip: Principal | ICD-10-CM | POA: Diagnosis present

## 2016-09-25 DIAGNOSIS — F419 Anxiety disorder, unspecified: Secondary | ICD-10-CM | POA: Diagnosis present

## 2016-09-25 DIAGNOSIS — Z96641 Presence of right artificial hip joint: Secondary | ICD-10-CM | POA: Diagnosis present

## 2016-09-25 DIAGNOSIS — Z87891 Personal history of nicotine dependence: Secondary | ICD-10-CM

## 2016-09-25 DIAGNOSIS — R110 Nausea: Secondary | ICD-10-CM | POA: Diagnosis not present

## 2016-09-25 DIAGNOSIS — Z96649 Presence of unspecified artificial hip joint: Secondary | ICD-10-CM

## 2016-09-25 DIAGNOSIS — M25552 Pain in left hip: Secondary | ICD-10-CM | POA: Diagnosis present

## 2016-09-25 HISTORY — PX: TOTAL HIP ARTHROPLASTY: SHX124

## 2016-09-25 LAB — ABO/RH: ABO/RH(D): O POS

## 2016-09-25 SURGERY — ARTHROPLASTY, HIP, TOTAL,POSTERIOR APPROACH
Anesthesia: General | Laterality: Left | Wound class: Clean

## 2016-09-25 MED ORDER — PROPOFOL 10 MG/ML IV BOLUS
INTRAVENOUS | Status: DC | PRN
  Administered 2016-09-25: 50 mg via INTRAVENOUS

## 2016-09-25 MED ORDER — PROPOFOL 500 MG/50ML IV EMUL
INTRAVENOUS | Status: DC | PRN
  Administered 2016-09-25: 75 ug/kg/min via INTRAVENOUS

## 2016-09-25 MED ORDER — VITAMIN D 1000 UNITS PO TABS
2000.0000 [IU] | ORAL_TABLET | Freq: Every day | ORAL | Status: DC
  Administered 2016-09-25 – 2016-09-27 (×3): 2000 [IU] via ORAL
  Filled 2016-09-25 (×3): qty 2

## 2016-09-25 MED ORDER — TRANEXAMIC ACID 1000 MG/10ML IV SOLN
1000.0000 mg | Freq: Once | INTRAVENOUS | Status: AC
  Administered 2016-09-25: 1000 mg via INTRAVENOUS
  Filled 2016-09-25: qty 10

## 2016-09-25 MED ORDER — GLYCOPYRROLATE 0.2 MG/ML IJ SOLN
INTRAMUSCULAR | Status: DC | PRN
  Administered 2016-09-25: 0.2 mg via INTRAVENOUS

## 2016-09-25 MED ORDER — BUPIVACAINE HCL (PF) 0.5 % IJ SOLN
INTRAMUSCULAR | Status: AC
  Filled 2016-09-25: qty 10

## 2016-09-25 MED ORDER — TRAMADOL HCL 50 MG PO TABS
50.0000 mg | ORAL_TABLET | ORAL | Status: DC | PRN
  Administered 2016-09-25 – 2016-09-27 (×6): 100 mg via ORAL
  Filled 2016-09-25 (×6): qty 2

## 2016-09-25 MED ORDER — MIDAZOLAM HCL 2 MG/2ML IJ SOLN
INTRAMUSCULAR | Status: AC
  Filled 2016-09-25: qty 2

## 2016-09-25 MED ORDER — SODIUM CHLORIDE 0.9 % IV SOLN
INTRAVENOUS | Status: DC
  Administered 2016-09-25 (×2): via INTRAVENOUS

## 2016-09-25 MED ORDER — PROPOFOL 500 MG/50ML IV EMUL
INTRAVENOUS | Status: AC
  Filled 2016-09-25: qty 100

## 2016-09-25 MED ORDER — BUPIVACAINE HCL (PF) 0.5 % IJ SOLN
INTRAMUSCULAR | Status: DC | PRN
  Administered 2016-09-25: 2 mL

## 2016-09-25 MED ORDER — ACETAMINOPHEN 10 MG/ML IV SOLN
INTRAVENOUS | Status: AC
Start: 2016-09-25 — End: 2016-09-25
  Filled 2016-09-25: qty 100

## 2016-09-25 MED ORDER — MORPHINE SULFATE (PF) 2 MG/ML IV SOLN
2.0000 mg | INTRAVENOUS | Status: DC | PRN
  Administered 2016-09-25: 2 mg via INTRAVENOUS
  Filled 2016-09-25 (×2): qty 1

## 2016-09-25 MED ORDER — OXYCODONE HCL 5 MG PO TABS
5.0000 mg | ORAL_TABLET | ORAL | Status: DC | PRN
  Administered 2016-09-25 – 2016-09-26 (×2): 10 mg via ORAL
  Filled 2016-09-25 (×2): qty 2

## 2016-09-25 MED ORDER — ENOXAPARIN SODIUM 30 MG/0.3ML ~~LOC~~ SOLN
30.0000 mg | Freq: Two times a day (BID) | SUBCUTANEOUS | Status: DC
  Administered 2016-09-26 – 2016-09-27 (×3): 30 mg via SUBCUTANEOUS
  Filled 2016-09-25 (×3): qty 0.3

## 2016-09-25 MED ORDER — POLYVINYL ALCOHOL 1.4 % OP SOLN
1.0000 [drp] | OPHTHALMIC | Status: DC | PRN
  Administered 2016-09-25: 1 [drp] via OPHTHALMIC
  Filled 2016-09-25 (×2): qty 15

## 2016-09-25 MED ORDER — ONDANSETRON HCL 4 MG/2ML IJ SOLN: 4.0000 mg | Freq: Once | INTRAMUSCULAR | Status: DC | PRN

## 2016-09-25 MED ORDER — MENTHOL 3 MG MT LOZG
1.0000 | LOZENGE | OROMUCOSAL | Status: DC | PRN
  Filled 2016-09-25: qty 9

## 2016-09-25 MED ORDER — ONDANSETRON HCL 4 MG PO TABS: 4.0000 mg | ORAL_TABLET | Freq: Four times a day (QID) | ORAL | Status: DC | PRN

## 2016-09-25 MED ORDER — GLYCOPYRROLATE 0.2 MG/ML IJ SOLN
INTRAMUSCULAR | Status: AC
  Filled 2016-09-25: qty 1

## 2016-09-25 MED ORDER — PHENOL 1.4 % MT LIQD
1.0000 | OROMUCOSAL | Status: DC | PRN
  Filled 2016-09-25: qty 177

## 2016-09-25 MED ORDER — METOCLOPRAMIDE HCL 10 MG PO TABS
10.0000 mg | ORAL_TABLET | Freq: Three times a day (TID) | ORAL | Status: DC
  Administered 2016-09-25 – 2016-09-27 (×7): 10 mg via ORAL
  Filled 2016-09-25 (×8): qty 1

## 2016-09-25 MED ORDER — ALUM & MAG HYDROXIDE-SIMETH 200-200-20 MG/5ML PO SUSP: 30.0000 mL | ORAL | Status: DC | PRN

## 2016-09-25 MED ORDER — ACETAMINOPHEN 650 MG RE SUPP: 650.0000 mg | Freq: Four times a day (QID) | RECTAL | Status: DC | PRN

## 2016-09-25 MED ORDER — FIBER ADULT GUMMIES 2 G PO CHEW: 1.0000 | CHEWABLE_TABLET | Freq: Every day | ORAL | Status: DC

## 2016-09-25 MED ORDER — LORATADINE 10 MG PO TABS
10.0000 mg | ORAL_TABLET | Freq: Every day | ORAL | Status: DC
  Administered 2016-09-25 – 2016-09-27 (×3): 10 mg via ORAL
  Filled 2016-09-25 (×3): qty 1

## 2016-09-25 MED ORDER — VITAMIN B-6 50 MG PO TABS
50.0000 mg | ORAL_TABLET | ORAL | Status: DC
  Administered 2016-09-25: 50 mg via ORAL
  Filled 2016-09-25 (×2): qty 1

## 2016-09-25 MED ORDER — DEXTROSE 5 % IV SOLN
2.0000 g | Freq: Four times a day (QID) | INTRAVENOUS | Status: AC
  Administered 2016-09-25 – 2016-09-26 (×4): 2 g via INTRAVENOUS
  Filled 2016-09-25 (×4): qty 20

## 2016-09-25 MED ORDER — ACETAMINOPHEN 325 MG PO TABS: 650.0000 mg | ORAL_TABLET | Freq: Four times a day (QID) | ORAL | Status: DC | PRN

## 2016-09-25 MED ORDER — PROPOFOL 500 MG/50ML IV EMUL
INTRAVENOUS | Status: AC
  Filled 2016-09-25: qty 50

## 2016-09-25 MED ORDER — FERROUS SULFATE 325 (65 FE) MG PO TABS
325.0000 mg | ORAL_TABLET | Freq: Two times a day (BID) | ORAL | Status: DC
  Administered 2016-09-25 – 2016-09-27 (×4): 325 mg via ORAL
  Filled 2016-09-25 (×4): qty 1

## 2016-09-25 MED ORDER — FLEET ENEMA 7-19 GM/118ML RE ENEM: 1.0000 | ENEMA | Freq: Once | RECTAL | Status: DC | PRN

## 2016-09-25 MED ORDER — ACETAMINOPHEN 10 MG/ML IV SOLN
INTRAVENOUS | Status: DC | PRN
  Administered 2016-09-25: 1000 mg via INTRAVENOUS

## 2016-09-25 MED ORDER — DIPHENHYDRAMINE HCL 12.5 MG/5ML PO ELIX: 12.5000 mg | ORAL_SOLUTION | ORAL | Status: DC | PRN

## 2016-09-25 MED ORDER — FENTANYL CITRATE (PF) 100 MCG/2ML IJ SOLN
INTRAMUSCULAR | Status: AC
Start: 2016-09-25 — End: 2016-09-25
  Filled 2016-09-25: qty 2

## 2016-09-25 MED ORDER — NEOMYCIN-POLYMYXIN B GU 40-200000 IR SOLN
Status: DC | PRN
  Administered 2016-09-25: 16 mL

## 2016-09-25 MED ORDER — SODIUM CHLORIDE FLUSH 0.9 % IV SOLN
INTRAVENOUS | Status: AC
  Filled 2016-09-25: qty 3

## 2016-09-25 MED ORDER — SENNOSIDES-DOCUSATE SODIUM 8.6-50 MG PO TABS
1.0000 | ORAL_TABLET | Freq: Two times a day (BID) | ORAL | Status: DC
  Administered 2016-09-25 – 2016-09-27 (×5): 1 via ORAL
  Filled 2016-09-25 (×5): qty 1

## 2016-09-25 MED ORDER — FAMOTIDINE 20 MG PO TABS
ORAL_TABLET | ORAL | Status: AC
  Filled 2016-09-25: qty 1

## 2016-09-25 MED ORDER — CELECOXIB 200 MG PO CAPS
200.0000 mg | ORAL_CAPSULE | Freq: Two times a day (BID) | ORAL | Status: DC
  Administered 2016-09-25 – 2016-09-27 (×5): 200 mg via ORAL
  Filled 2016-09-25 (×6): qty 1

## 2016-09-25 MED ORDER — KETOTIFEN FUMARATE 0.025 % OP SOLN
1.0000 [drp] | Freq: Every day | OPHTHALMIC | Status: DC
  Filled 2016-09-25: qty 5

## 2016-09-25 MED ORDER — PHENYLEPHRINE HCL 10 MG/ML IJ SOLN
INTRAMUSCULAR | Status: AC
  Filled 2016-09-25: qty 1

## 2016-09-25 MED ORDER — LACTATED RINGERS IV SOLN
INTRAVENOUS | Status: DC
  Administered 2016-09-25 (×2): via INTRAVENOUS

## 2016-09-25 MED ORDER — ONDANSETRON HCL 4 MG/2ML IJ SOLN
4.0000 mg | Freq: Four times a day (QID) | INTRAMUSCULAR | Status: DC | PRN
  Administered 2016-09-25: 4 mg via INTRAVENOUS
  Filled 2016-09-25: qty 2

## 2016-09-25 MED ORDER — FAMOTIDINE 20 MG PO TABS
20.0000 mg | ORAL_TABLET | Freq: Once | ORAL | Status: AC
  Administered 2016-09-25: 20 mg via ORAL

## 2016-09-25 MED ORDER — ACETAMINOPHEN 10 MG/ML IV SOLN
1000.0000 mg | Freq: Four times a day (QID) | INTRAVENOUS | Status: AC
  Administered 2016-09-25 (×3): 1000 mg via INTRAVENOUS
  Filled 2016-09-25 (×4): qty 100

## 2016-09-25 MED ORDER — BISACODYL 10 MG RE SUPP
10.0000 mg | Freq: Every day | RECTAL | Status: DC | PRN
  Administered 2016-09-27: 10 mg via RECTAL
  Filled 2016-09-25: qty 1

## 2016-09-25 MED ORDER — CHLORHEXIDINE GLUCONATE 4 % EX LIQD: 60.0000 mL | Freq: Once | CUTANEOUS | Status: DC

## 2016-09-25 MED ORDER — PANTOPRAZOLE SODIUM 40 MG PO TBEC
40.0000 mg | DELAYED_RELEASE_TABLET | Freq: Two times a day (BID) | ORAL | Status: DC
  Administered 2016-09-25 – 2016-09-27 (×4): 40 mg via ORAL
  Filled 2016-09-25 (×4): qty 1

## 2016-09-25 MED ORDER — FENTANYL CITRATE (PF) 100 MCG/2ML IJ SOLN
25.0000 ug | INTRAMUSCULAR | Status: DC | PRN
  Administered 2016-09-25 (×3): 25 ug via INTRAVENOUS

## 2016-09-25 MED ORDER — TETRACAINE HCL 1 % IJ SOLN
INTRAMUSCULAR | Status: DC | PRN
  Administered 2016-09-25: 1 mg via INTRASPINAL

## 2016-09-25 MED ORDER — TRANEXAMIC ACID 1000 MG/10ML IV SOLN
INTRAVENOUS | Status: DC | PRN
  Administered 2016-09-25: 1000 mg via INTRAVENOUS

## 2016-09-25 MED ORDER — MIDAZOLAM HCL 5 MG/5ML IJ SOLN
INTRAMUSCULAR | Status: DC | PRN
Start: 2016-09-25 — End: 2016-09-25
  Administered 2016-09-25: 2 mg via INTRAVENOUS

## 2016-09-25 MED ORDER — MAGNESIUM HYDROXIDE 400 MG/5ML PO SUSP
30.0000 mL | Freq: Every day | ORAL | Status: DC | PRN
  Administered 2016-09-27: 30 mL via ORAL
  Filled 2016-09-25 (×2): qty 30

## 2016-09-25 MED ORDER — SODIUM CHLORIDE 0.9 % IV SOLN
INTRAVENOUS | Status: DC | PRN
  Administered 2016-09-25: 50 ug/min via INTRAVENOUS

## 2016-09-25 SURGICAL SUPPLY — 54 items
BLADE DRUM FLTD (BLADE) ×3 IMPLANT
BLADE SAW 1 (BLADE) ×3 IMPLANT
CANISTER SUCT 1200ML W/VALVE (MISCELLANEOUS) ×3 IMPLANT
CANISTER SUCT 3000ML PPV (MISCELLANEOUS) ×6 IMPLANT
CAPT HIP TOTAL 2 ×3 IMPLANT
CARTRIDGE OIL MAESTRO DRILL (MISCELLANEOUS) ×1 IMPLANT
CATH FOL LEG HOLDER (MISCELLANEOUS) ×3 IMPLANT
CATH TRAY METER 16FR LF (MISCELLANEOUS) ×3 IMPLANT
DIFFUSER MAESTRO (MISCELLANEOUS) ×3 IMPLANT
DRAPE INCISE IOBAN 66X60 STRL (DRAPES) ×3 IMPLANT
DRAPE SHEET LG 3/4 BI-LAMINATE (DRAPES) ×3 IMPLANT
DRSG DERMACEA 8X12 NADH (GAUZE/BANDAGES/DRESSINGS) ×3 IMPLANT
DRSG OPSITE POSTOP 3X4 (GAUZE/BANDAGES/DRESSINGS) IMPLANT
DRSG OPSITE POSTOP 4X12 (GAUZE/BANDAGES/DRESSINGS) ×3 IMPLANT
DRSG OPSITE POSTOP 4X14 (GAUZE/BANDAGES/DRESSINGS) ×6 IMPLANT
DRSG TEGADERM 4X4.75 (GAUZE/BANDAGES/DRESSINGS) ×3 IMPLANT
DURAPREP 26ML APPLICATOR (WOUND CARE) ×6 IMPLANT
ELECT BLADE 6.5 EXT (BLADE) ×3 IMPLANT
ELECT CAUTERY BLADE 6.4 (BLADE) ×3 IMPLANT
GLOVE BIOGEL M STRL SZ7.5 (GLOVE) ×6 IMPLANT
GLOVE BIOGEL PI IND STRL 9 (GLOVE) ×1 IMPLANT
GLOVE BIOGEL PI INDICATOR 9 (GLOVE) ×2
GLOVE INDICATOR 8.0 STRL GRN (GLOVE) ×3 IMPLANT
GLOVE SURG SYN 9.0  PF PI (GLOVE) ×2
GLOVE SURG SYN 9.0 PF PI (GLOVE) ×1 IMPLANT
GOWN STRL REUS W/ TWL LRG LVL3 (GOWN DISPOSABLE) ×2 IMPLANT
GOWN STRL REUS W/TWL 2XL LVL3 (GOWN DISPOSABLE) ×3 IMPLANT
GOWN STRL REUS W/TWL LRG LVL3 (GOWN DISPOSABLE) ×4
HEMOVAC 400CC 10FR (MISCELLANEOUS) ×3 IMPLANT
HOOD PEEL AWAY FLYTE STAYCOOL (MISCELLANEOUS) ×6 IMPLANT
KIT RM TURNOVER STRD PROC AR (KITS) ×3 IMPLANT
NDL SAFETY 18GX1.5 (NEEDLE) ×3 IMPLANT
NS IRRIG 500ML POUR BTL (IV SOLUTION) ×3 IMPLANT
OIL CARTRIDGE MAESTRO DRILL (MISCELLANEOUS) ×3
PACK HIP PROSTHESIS (MISCELLANEOUS) ×3 IMPLANT
PIN STEIN THRED 5/32 (Pin) ×3 IMPLANT
PULSAVAC PLUS IRRIG FAN TIP (DISPOSABLE) ×3
SOL .9 NS 3000ML IRR  AL (IV SOLUTION) ×2
SOL .9 NS 3000ML IRR UROMATIC (IV SOLUTION) ×1 IMPLANT
SOL PREP PVP 2OZ (MISCELLANEOUS) ×3
SOLUTION PREP PVP 2OZ (MISCELLANEOUS) ×1 IMPLANT
SPONGE DRAIN TRACH 4X4 STRL 2S (GAUZE/BANDAGES/DRESSINGS) ×3 IMPLANT
STAPLER SKIN PROX 35W (STAPLE) ×3 IMPLANT
SUT ETHIBOND #5 BRAIDED 30INL (SUTURE) ×3 IMPLANT
SUT VIC AB 0 CT1 36 (SUTURE) ×3 IMPLANT
SUT VIC AB 1 CT1 36 (SUTURE) ×6 IMPLANT
SUT VIC AB 2-0 CT1 27 (SUTURE) ×2
SUT VIC AB 2-0 CT1 TAPERPNT 27 (SUTURE) ×1 IMPLANT
SYR 20CC LL (SYRINGE) ×3 IMPLANT
TAPE ADH 3 LX (MISCELLANEOUS) ×3 IMPLANT
TAPE TRANSPORE STRL 2 31045 (GAUZE/BANDAGES/DRESSINGS) ×3 IMPLANT
TIP BRUSH PULSAVAC PLUS 24.33 (MISCELLANEOUS) IMPLANT
TIP FAN IRRIG PULSAVAC PLUS (DISPOSABLE) ×1 IMPLANT
TOWEL OR 17X26 4PK STRL BLUE (TOWEL DISPOSABLE) ×3 IMPLANT

## 2016-09-25 NOTE — Op Note (Signed)
OPERATIVE NOTE  DATE OF SURGERY:  09/25/2016  PATIENT NAME:  Shannon [name]   DOB: Oct 08, 1950  MRN: [id number]  PRE-OPERATIVE DIAGNOSIS: Degenerative arthrosis of the left hip, primary  POST-OPERATIVE DIAGNOSIS:  Same  PROCEDURE:  Left total hip arthroplasty  SURGEON:  Marciano Sequin. M.D.  ASSISTANT:  Vance Peper, PA (present and scrubbed throughout the case, critical for assistance with exposure, retraction, instrumentation, and closure)  ANESTHESIA: spinal  ESTIMATED BLOOD LOSS: 100 mL  FLUIDS REPLACED: 1700 mL of crystalloid  DRAINS: 2 medium drains to a Hemovac reservoir  IMPLANTS UTILIZED: DePuy 13.5 mm small stature AML femoral stem, 52 mm OD Pinnacle 100 acetabular component, +4 mm neutral Pinnacle Marathon polyethylene insert, and a 36 mm M-SPEC +1.5 mm hip ball  INDICATIONS FOR SURGERY: Shannon [name] is a 66 y.o. year old female with a long history of progressive hip and groin  pain. X-rays demonstrated severe degenerative changes. The patient had not seen any significant improvement despite conservative nonsurgical intervention. After discussion of the risks and benefits of surgical intervention, the patient expressed understanding of the risks benefits and agree with plans for total hip arthroplasty.   The risks, benefits, and alternatives were discussed at length including but not limited to the risks of infection, bleeding, nerve injury, stiffness, blood clots, the need for revision surgery, limb length inequality, dislocation, cardiopulmonary complications, among others, and they were willing to proceed.  PROCEDURE IN DETAIL: The patient was brought into the operating room and, after adequate spinal anesthesia was achieved, the patient was placed in a right lateral decubitus position. Axillary roll was placed and all bony prominences were well-padded. The patient's left hip was cleaned and prepped with alcohol and DuraPrep and draped in the usual sterile fashion. A  "timeout" was performed as per usual protocol. A lateral curvilinear incision was made gently curving towards the posterior superior iliac spine. The IT band was incised in line with the skin incision and the fibers of the gluteus maximus were split in line. The piriformis tendon was identified, skeletonized, and incised at its insertion to the proximal femur and reflected posteriorly. A T type posterior capsulotomy was performed. Prior to dislocation of the femoral head, a threaded Steinmann pin was inserted through a separate stab incision into the pelvis superior to the acetabulum and bent in the form of a stylus so as to assess limb length and hip offset throughout the procedure. The femoral head was then dislocated posteriorly. Inspection of the femoral head demonstrated severe degenerative changes with full-thickness loss of articular cartilage. The femoral neck cut was performed using an oscillating saw. The anterior capsule was elevated off of the femoral neck using a periosteal elevator. Attention was then directed to the acetabulum. The remnant of the labrum was excised using electrocautery. Inspection of the acetabulum also demonstrated significant degenerative changes. The acetabulum was reamed in sequential fashion up to a 51 mm diameter. Good punctate bleeding bone was encountered. A 52 mm Pinnacle 100 acetabular component was positioned and impacted into place. Good scratch fit was appreciated. A +4 mm neutral polyethylene trial was inserted.  Attention was then directed to the proximal femur. A hole for reaming of the proximal femoral canal was created using a high-speed burr. The femoral canal was reamed in sequential fashion up to a 13 mm diameter. This allowed for approximately 5 cm of scratch fit. Serial broaches were inserted up to a 13.5 mm small stature femoral broach. Calcar region was planed and a  trial reduction was performed using a 36 mm hip ball with a +1.5 mm neck length. Good  equalization of limb lengths and hip offset was appreciated and excellent stability was noted both anteriorly and posteriorly. Trial components were removed. The acetabular shell was irrigated with copious amounts of normal saline with antibiotic solution and suctioned dry. A +4 mm neutral Pinnacle Marathon polyethylene insert was positioned and impacted into place. Next, a 13.5 mm small stature AML femoral stem was positioned and impacted into place. Excellent scratch fit was appreciated. A trial reduction was again performed with a 36 mm hip ball with a +1.5 mm neck length. Again, good equalization of limb lengths was appreciated and excellent stability appreciated both anteriorly and posteriorly. The hip was then dislocated and the trial hip ball was removed. The Morse taper was cleaned and dried. A 36 mm M-SPEC hip ball with a +1.5 mm neck length was placed on the trunnion and impacted into place. The hip was then reduced and placed through range of motion. Excellent stability was appreciated both anteriorly and posteriorly.  The wound was irrigated with copious amounts of normal saline with antibiotic solution and suctioned dry. Good hemostasis was appreciated. The posterior capsulotomy was repaired using #5 Ethibond. Piriformis tendon was reapproximated to the undersurface of the gluteus medius tendon using #5 Ethibond. Two medium drains were placed in the wound bed and brought out through separate stab incisions to be attached to a Hemovac reservoir. The IT band was reapproximated using interrupted sutures of #1 Vicryl. Subcutaneous tissue was approximated using first #0 Vicryl followed by #2-0 Vicryl. The skin was closed with skin staples.  The patient tolerated the procedure well and was transported to the recovery room in stable condition.   Marciano Sequin., M.D.

## 2016-09-25 NOTE — Evaluation (Signed)
Physical Therapy Evaluation Patient Details Name: Shannon [name] MRN: [id number] DOB: December 08, 1950 Today's Date: 09/25/2016   History of Present Illness  admitted status post L THR (09/25/16), posterior THPs, WBAT.  Clinical Impression  Patient rating pain 8/10 upon entry to room, but willing/eager to attempt change of position and OOB for pain control.  Demonstrates good post-op strength and ROM to L hip despite pain rating.  Able to complete bed mobility with min/mod assist (for L LE management and scooting towards edge of bed); sit/stand, basic transfers and short-distance gait (5') with RW, min assist.  Decreased stance time/weight acceptance to L LE during movement transitions and mobility efforts; however, fair/good stability without buckling or LOB noted.  Anticipate good progression towards all mobility goals in subsequent sessions. Would benefit from skilled PT to address above deficits and promote optimal return to PLOF; Recommend transition to [facility] upon discharge from acute hospitalization.     Follow Up Recommendations Home health PT    Equipment Recommendations  Rolling walker with 5" wheels;3in1 (PT)    Recommendations for Other Services       Precautions / Restrictions Precautions Precautions: Fall;Posterior Hip Restrictions Weight Bearing Restrictions: No      Mobility  Bed Mobility Overal bed mobility: Needs Assistance Bed Mobility: Supine to Sit     Supine to sit: Min assist;Mod assist     General bed mobility comments: assist for L LE position and scooting towards edge of bed  Transfers Overall transfer level: Needs assistance Equipment used: Rolling walker (2 wheeled) Transfers: Sit to/from Stand Sit to Stand: Min assist         General transfer comment: cuing for hand placement and L LE alignment (to prevent excessive IR); maintains excessive weight shift to R LE throughout movement transition  Ambulation/Gait Ambulation/Gait assistance: Min  assist Ambulation Distance (Feet): 5 Feet Assistive device: Rolling walker (2 wheeled)       General Gait Details: step to gait pattern; decreased stance time/weight acceptance L LE  Stairs            Wheelchair Mobility    Modified Rankin (Stroke Patients Only)       Balance Overall balance assessment: Needs assistance Sitting-balance support: No upper extremity supported;Feet supported Sitting balance-Leahy Scale: Good     Standing balance support: Bilateral upper extremity supported Standing balance-Leahy Scale: Fair                               Pertinent Vitals/Pain Pain Assessment: 0-10 Pain Score: 8  Pain Location: L hip Pain Descriptors / Indicators: Aching;Grimacing;Guarding Pain Intervention(s): Limited activity within patient's tolerance;Monitored during session;Premedicated before session;Repositioned;RN gave pain meds during session    [place] expects to be discharged to:: Private residence Living Arrangements: Children Available Help at Discharge: Family;Available 24 hours/day Type of Home: House (planning to discharge to boyfriends' home) Home Access: Stairs to enter Entrance Stairs-Rails: None Entrance Stairs-Number of Steps: 1 Home Layout: One level Home Equipment: Walker - 2 wheels      Prior Function Level of Independence: Independent         Comments: Indep with ADLs, household and community mobility; intermittent use of SPC/RW when pain in L hip bad.  Denies fall in recent six months.     Hand Dominance        Extremity/Trunk Assessment   Upper Extremity Assessment Upper Extremity Assessment: Overall WFL for tasks assessed    Lower  Extremity Assessment Lower Extremity Assessment:  (L LE grossly at least 3-/5, limited by post-op pain.  Knee and ankle, 4+ to 5/5 throughout.  Full sensation intact)       Communication   Communication: No difficulties  Cognition Arousal/Alertness:  Awake/alert Behavior During Therapy: WFL for tasks assessed/performed Overall Cognitive Status: Within Functional Limits for tasks assessed                                        General Comments      Exercises Other Exercises Other Exercises: Supine LE therex, 1x10, act assist ROM for muscular strength/endurance: ankle pumps, quad sets, SAQs, heel slides, hip abduct/adduct   Assessment/Plan    PT Assessment Patient needs continued PT services  PT Problem List Decreased strength;Decreased range of motion;Decreased balance;Decreased activity tolerance;Decreased mobility;Decreased coordination;Decreased knowledge of precautions;Decreased cognition;Pain       PT Treatment Interventions DME instruction;Gait training;Balance training;Stair training;Functional mobility training;Therapeutic activities;Therapeutic exercise;Patient/family education    PT Goals (Current goals can be found in the Care Plan section)  Acute Rehab PT Goals Patient Stated Goal: to change positions and see if it helps my pain PT Goal Formulation: With patient Time For Goal Achievement: 10/09/16 Potential to Achieve Goals: Good    Frequency BID   Barriers to discharge        Co-evaluation               AM-PAC PT "6 Clicks" Daily Activity  Outcome Measure Difficulty turning over in bed (including adjusting bedclothes, sheets and blankets)?: Total Difficulty moving from lying on back to sitting on the side of the bed? : Total Difficulty sitting down on and standing up from a chair with arms (e.g., wheelchair, bedside commode, etc,.)?: Total Help needed moving to and from a bed to chair (including a wheelchair)?: A Little Help needed walking in hospital room?: A Little Help needed climbing 3-5 steps with a railing? : A Lot 6 Click Score: 11    End of Session Equipment Utilized During Treatment: Gait belt Activity Tolerance: Patient tolerated treatment well Patient left: in  chair;with call bell/phone within reach;with chair alarm set Nurse Communication: Mobility status PT Visit Diagnosis: Pain;Muscle weakness (generalized) (M62.81);Difficulty in walking, not elsewhere classified (R26.2) Pain - Right/Left: Left Pain - part of body: Hip    Time: 9038-3338 PT Time Calculation (min) (ACUTE ONLY): 27 min   Charges:   PT Evaluation $PT Eval Low Complexity: 1 Procedure PT Treatments $Therapeutic Exercise: 8-22 mins   PT G Codes:        Adonia Porada H. Owens Shark, PT, DPT, NCS 09/25/16, 5:21 PM [phone]

## 2016-09-25 NOTE — Anesthesia Post-op Follow-up Note (Cosign Needed)
Anesthesia QCDR form completed.        

## 2016-09-25 NOTE — Care Management Note (Signed)
Case Management Note  Patient Details  Name: Shannon [name] MRN: [id number] Date of Birth: 11/29/1950  Subjective/Objective:   RNCM consult for home health needs. POD s/p left hip arthroplasty. PT pending. Patient from home with spouse. Following progression with PT pending.                 Action/Plan:   Expected Discharge Date:                  Expected Discharge Plan:     In-House Referral:     Discharge planning Services  CM Consult  Post Acute Care Choice:    Choice offered to:     DME Arranged:    DME Agency:     HH Arranged:    HH Agency:     Status of Service:  In process, will continue to follow  If discussed at Long Length of Stay Meetings, dates discussed:    Additional Comments:  Jolly Mango, RN 09/25/2016, 12:42 PM

## 2016-09-25 NOTE — Progress Notes (Signed)
Chaplin present to attempt for patients living will, No notary available and patient is full aware.  Unable To complete for patient at this time.  Patient verbalized Understanding and is ok with proceeding with surgery.

## 2016-09-25 NOTE — Transfer of Care (Signed)
Immediate Anesthesia Transfer of Care Note  Patient: Shannon [name]  Procedure(s) Performed: Procedure(s): TOTAL HIP ARTHROPLASTY (Left)  Patient Location: PACU  Anesthesia Type:Spinal  Level of Consciousness: awake and sedated  Airway & Oxygen Therapy: Patient Spontanous Breathing and Patient connected to face mask oxygen  Post-op Assessment: Report given to RN and Post -op Vital signs reviewed and stable  Post vital signs: Reviewed and stable  Last Vitals:  Vitals:   09/25/16 0612  BP: 113/61  Pulse: 74  Resp: 18  Temp: 36.4 C    Last Pain:  Vitals:   09/25/16 0612  TempSrc: Oral  PainSc: 2          Complications: No apparent anesthesia complications

## 2016-09-25 NOTE — Anesthesia Preprocedure Evaluation (Signed)
Anesthesia Evaluation  Patient identified by MRN, date of birth, ID band Patient awake    Reviewed: Allergy & Precautions, H&P , NPO status , Patient's Chart, lab work & pertinent test results, reviewed documented beta blocker date and time   Airway Mallampati: II   Neck ROM: full    Dental  (+) Teeth Intact   Pulmonary neg pulmonary ROS, former smoker,    Pulmonary exam normal        Cardiovascular negative cardio ROS Normal cardiovascular exam Rhythm:regular Rate:Normal     Neuro/Psych  Headaches, negative neurological ROS  negative psych ROS   GI/Hepatic negative GI ROS, Neg liver ROS,   Endo/Other  negative endocrine ROS  Renal/GU negative Renal ROS  negative genitourinary   Musculoskeletal   Abdominal   Peds  Hematology negative hematology ROS (+)   Anesthesia Other Findings Past Medical History: No date: Anxiety No date: Arthritis     Comment: Osteoarthritis No date: Headache Past Surgical History: No date: CERVICAL CERCLAGE     Comment:  X 3 08/24/2014: TOTAL HIP ARTHROPLASTY Right     Comment: Dr. Marry Guan, [facility]   Reproductive/Obstetrics negative OB ROS                             Anesthesia Physical Anesthesia Plan  ASA: II  Anesthesia Plan: General and Spinal   Post-op Pain Management:    Induction:   Airway Management Planned:   Additional Equipment:   Intra-op Plan:   Post-operative Plan:   Informed Consent: I have reviewed the patients History and Physical, chart, labs and discussed the procedure including the risks, benefits and alternatives for the proposed anesthesia with the patient or authorized representative who has indicated his/her understanding and acceptance.   Dental Advisory Given  Plan Discussed with: CRNA  Anesthesia Plan Comments:         Anesthesia Quick Evaluation

## 2016-09-25 NOTE — Anesthesia Procedure Notes (Signed)
Date/Time: 09/25/2016 8:24 AM Performed by: Nelda Marseille Pre-anesthesia Checklist: Patient identified, Emergency Drugs available, Suction available, Patient being monitored and Timeout performed Oxygen Delivery Method: Simple face mask

## 2016-09-25 NOTE — NC FL2 (Signed)
[place] LEVEL OF CARE SCREENING TOOL     IDENTIFICATION  Patient Name: Shannon [name] Birthdate: [birth year] Sex: female Admission Date (Current Location): 09/25/2016  [place] and [place] Number:  Engineering geologist and Address:  [facility], [address] [place], [place] [address]      Provider Number: [id number]  Attending Physician Name and Address:  Dereck Leep, MD  Relative Name and Phone Number:       Current Level of Care: Hospital Recommended Level of Care: [place] Prior Approval Number:    Date Approved/Denied:   PASRR Number:  [id number])  Discharge Plan: SNF    Current Diagnoses: Patient Active Problem List   Diagnosis Date Noted  . Status post total replacement of hip 09/25/2016    Orientation RESPIRATION BLADDER Height & Weight     Self, Time, Situation, Place  Normal Continent Weight:   Height:     BEHAVIORAL SYMPTOMS/MOOD NEUROLOGICAL BOWEL NUTRITION STATUS   (none)  (none) Continent Diet (Diet: Clear Liquid to be advanced. )  AMBULATORY STATUS COMMUNICATION OF NEEDS Skin   Extensive Assist Verbally Surgical wounds (Incision: Left Hip. )                       Personal Care Assistance Level of Assistance  Bathing, Feeding, Dressing Bathing Assistance: Limited assistance Feeding assistance: Independent Dressing Assistance: Limited assistance     Functional Limitations Info  Sight, Hearing, Speech Sight Info: Adequate Hearing Info: Adequate Speech Info: Adequate    SPECIAL CARE FACTORS FREQUENCY  PT (By licensed PT), OT (By licensed OT)     PT Frequency:  (5) OT Frequency:  (5)            Contractures      Additional Factors Info  Code Status, Allergies Code Status Info:  (Full Code. ) Allergies Info:  (Wellbutrin Bupropion, Zoloft Sertraline Hcl)           Current Medications (09/25/2016):  This is the current hospital active medication list Current  Facility-Administered Medications  Medication Dose Route Frequency Provider Last Rate Last Dose  . 0.9 %  sodium chloride infusion   Intravenous Continuous Hooten, Laurice Record, MD 100 mL/hr at 09/25/16 1215    . acetaminophen (OFIRMEV) IV 1,000 mg  1,000 mg Intravenous Q6H Hooten, Laurice Record, MD   Stopped at 09/25/16 1343  . acetaminophen (TYLENOL) tablet 650 mg  650 mg Oral Q6H PRN Hooten, Laurice Record, MD       Or  . acetaminophen (TYLENOL) suppository 650 mg  650 mg Rectal Q6H PRN Hooten, Laurice Record, MD      . alum & mag hydroxide-simeth (MAALOX/MYLANTA) 200-200-20 MG/5ML suspension 30 mL  30 mL Oral Q4H PRN Hooten, Laurice Record, MD      . bisacodyl (DULCOLAX) suppository 10 mg  10 mg Rectal Daily PRN Hooten, Laurice Record, MD      . ceFAZolin (ANCEF) 2 g in dextrose 5 % 100 mL IVPB  2 g Intravenous Q6H Hooten, Laurice Record, MD 240 mL/hr at 09/25/16 1348 2 g at 09/25/16 1348  . celecoxib (CELEBREX) capsule 200 mg  200 mg Oral Q12H Hooten, Laurice Record, MD   200 mg at 09/25/16 1235  . cholecalciferol (VITAMIN D) tablet 2,000 Units  2,000 Units Oral Daily Hooten, Laurice Record, MD   2,000 Units at 09/25/16 1235  . diphenhydrAMINE (BENADRYL) 12.5 MG/5ML elixir 12.5-25 mg  12.5-25 mg Oral Q4H PRN  Dereck Leep, MD      . Derrill Memo ON 09/26/2016] enoxaparin (LOVENOX) injection 30 mg  30 mg Subcutaneous Q12H Hooten, Laurice Record, MD      . famotidine (PEPCID) 20 MG tablet           . fentaNYL (SUBLIMAZE) 100 MCG/2ML injection           . ferrous sulfate tablet 325 mg  325 mg Oral BID WC Hooten, Laurice Record, MD      . ketotifen (ZADITOR) 0.025 % ophthalmic solution 1 drop  1 drop Both Eyes Daily Hooten, Laurice Record, MD      . loratadine (CLARITIN) tablet 10 mg  10 mg Oral Daily Hooten, Laurice Record, MD      . magnesium hydroxide (MILK OF MAGNESIA) suspension 30 mL  30 mL Oral Daily PRN Hooten, Laurice Record, MD      . menthol-cetylpyridinium (CEPACOL) lozenge 3 mg  1 lozenge Oral PRN Hooten, Laurice Record, MD       Or  . phenol (CHLORASEPTIC) mouth spray 1 spray  1 spray  Mouth/Throat PRN Hooten, Laurice Record, MD      . metoCLOPramide (REGLAN) tablet 10 mg  10 mg Oral TID AC & HS Hooten, Laurice Record, MD      . morphine 2 MG/ML injection 2 mg  2 mg Intravenous Q2H PRN Hooten, Laurice Record, MD   2 mg at 09/25/16 1348  . ondansetron (ZOFRAN) tablet 4 mg  4 mg Oral Q6H PRN Hooten, Laurice Record, MD       Or  . ondansetron (ZOFRAN) injection 4 mg  4 mg Intravenous Q6H PRN Hooten, Laurice Record, MD      . oxyCODONE (Oxy IR/ROXICODONE) immediate release tablet 5-10 mg  5-10 mg Oral Q4H PRN Hooten, Laurice Record, MD      . pantoprazole (PROTONIX) EC tablet 40 mg  40 mg Oral BID AC Hooten, Laurice Record, MD      . polyvinyl alcohol (LIQUIFILM TEARS) 1.4 % ophthalmic solution 1-2 drop  1-2 drop Both Eyes PRN Hooten, Laurice Record, MD      . pyridOXINE (VITAMIN B-6) tablet 50 mg  50 mg Oral Once per day on Mon Wed Fri Hooten, Laurice Record, MD      . senna-docusate (Senokot-S) tablet 1 tablet  1 tablet Oral BID Hooten, Laurice Record, MD      . sodium chloride flush 0.9 % injection           . sodium phosphate (FLEET) 7-19 GM/118ML enema 1 enema  1 enema Rectal Once PRN Hooten, Laurice Record, MD      . traMADol Veatrice Bourbon) tablet 50-100 mg  50-100 mg Oral Q4H PRN Dereck Leep, MD   100 mg at 09/25/16 1235     Discharge Medications: Please see discharge summary for a list of discharge medications.  Relevant Imaging Results:  Relevant Lab Results:   Additional Information  (SSN: [id number])  Velvet Moomaw, Veronia Beets, LCSW

## 2016-09-25 NOTE — Anesthesia Procedure Notes (Signed)
Spinal  Patient location during procedure: OR Start time: 09/25/2016 7:25 AM End time: 09/25/2016 7:39 AM Staffing Anesthesiologist: Molli Barrows Performed: anesthesiologist  Preanesthetic Checklist Completed: patient identified, site marked, surgical consent, pre-op evaluation, timeout performed, IV checked, risks and benefits discussed and monitors and equipment checked Spinal Block Patient position: sitting Prep: Betadine Patient monitoring: heart rate, continuous pulse ox, blood pressure and cardiac monitor Approach: midline Location: L4-5 Injection technique: single-shot Needle Needle type: Introducer and Quincke  Needle gauge: 22 G Needle length: 9 cm Additional Notes Negative paresthesia. Negative blood return. Positive free-flowing CSF. Expiration date of kit checked and confirmed. Patient tolerated procedure well, without complications. 1st attempt with 25g [place] Nelda Marseille CRNA not successful.  2nd attempt per MDA Adams with 22g Quincke successful.  No complications.  Patient comfortable

## 2016-09-25 NOTE — Progress Notes (Signed)
[facility] responded to a PG for an AD in SDS-15. Pt had filled out her AD at home and was hoping for completion before her surgery. Several attempts were made by the RN and [place] to secure a notary with none available. Pt is understanding and will complete it after surgery at [facility] or home.    09/25/16 0700  Clinical Encounter Type  Visited With Patient;Patient and family together;Health care provider  Visit Type Initial;Spiritual support;Pre-op  Referral From Nurse  Consult/Referral To Chaplain  Spiritual Encounters  Spiritual Needs Literature

## 2016-09-25 NOTE — Progress Notes (Signed)
Pt picking at left eye  States it feels dry   Called dr Andree Elk  flusheh left eye with saline   Eyelashes look like they were turned inward

## 2016-09-25 NOTE — H&P (Signed)
The patient has been re-examined, and the chart reviewed, and there have been no interval changes to the documented history and physical.    The risks, benefits, and alternatives have been discussed at length. The patient expressed understanding of the risks benefits and agreed with plans for surgical intervention.  Shannon [name], Jr. M.D.    

## 2016-09-25 NOTE — Progress Notes (Signed)
Pt arrived from PACU at approx 1230 via bed. Pt is alert, in pain to her L hip area, is able to feel to mid thigh as spinal is wearing off. Pt is on room air, lungs clear bilat, Hr is regular and brady at 96, pt tells this Probation officer that nobody has told her in the past that she has a slow heart rate. Per PACU nurse report, anesthesia made aware of pt's heart rate and stated they would not treat this unless pt has low bp as well. piv #20 intact to R hand, pt has honeycomb dressing to l lateral hip area and hemovac intact with scant serosanguinous drainage noted. Teds and foot pumps on bilat. Skin is intact except for surgical site. Since arrival, pt has received pain medication x3 and iv zofran as well for slight nausea, pt has participated in physical therapy and is now oob to chair, diet has been advanced, pt is able ot demonstrate using incentive spirometer. Visitor at bedside. Pt has call bell in reach.

## 2016-09-25 NOTE — Progress Notes (Signed)
Dr Andree Elk in to see pt  Checked eye   Flushed eye again with saline   Feeling better

## 2016-09-25 NOTE — Progress Notes (Signed)
Pt heart rate in upper 40's at times   No new orders from dr Andree Elk

## 2016-09-26 ENCOUNTER — Encounter: Payer: Self-pay | Admitting: Orthopedic Surgery

## 2016-09-26 LAB — BASIC METABOLIC PANEL
ANION GAP: 8 (ref 5–15)
BUN: 10 mg/dL (ref 6–20)
CALCIUM: 8.8 mg/dL — AB (ref 8.9–10.3)
CO2: 26 mmol/L (ref 22–32)
Chloride: 100 mmol/L — ABNORMAL LOW (ref 101–111)
Creatinine, Ser: 0.72 mg/dL (ref 0.44–1.00)
GLUCOSE: 110 mg/dL — AB (ref 65–99)
POTASSIUM: 3.8 mmol/L (ref 3.5–5.1)
Sodium: 134 mmol/L — ABNORMAL LOW (ref 135–145)

## 2016-09-26 LAB — CBC
HEMATOCRIT: 33.6 % — AB (ref 35.0–47.0)
Hemoglobin: 11.2 g/dL — ABNORMAL LOW (ref 12.0–16.0)
MCH: 28.7 pg (ref 26.0–34.0)
MCHC: 33.4 g/dL (ref 32.0–36.0)
MCV: 85.8 fL (ref 80.0–100.0)
PLATELETS: 273 10*3/uL (ref 150–440)
RBC: 3.91 MIL/uL (ref 3.80–5.20)
RDW: 14.9 % — AB (ref 11.5–14.5)
WBC: 11.6 10*3/uL — AB (ref 3.6–11.0)

## 2016-09-26 NOTE — Anesthesia Postprocedure Evaluation (Signed)
Anesthesia Post Note  Patient: Shannon [name]  Procedure(s) Performed: Procedure(s) (LRB): TOTAL HIP ARTHROPLASTY (Left)  Patient location during evaluation: Nursing Unit Anesthesia Type: Spinal Level of consciousness: awake and awake and alert Pain management: pain level controlled Vital Signs Assessment: post-procedure vital signs reviewed and stable Respiratory status: spontaneous breathing and nonlabored ventilation Cardiovascular status: blood pressure returned to baseline and stable Postop Assessment: no headache and no backache Anesthetic complications: no     Last Vitals:  Vitals:   09/25/16 1944 09/25/16 2333  BP: (!) 115/59 (!) 100/54  Pulse: 68 73  Resp: 18 18  Temp: 36.7 C 37.1 C    Last Pain:  Vitals:   09/26/16 0007  TempSrc:   PainSc: 2                  Johnna Acosta

## 2016-09-26 NOTE — Evaluation (Signed)
Occupational Therapy Evaluation Patient Details Name: Shannon [name] MRN: [id number] DOB: 03/12/1951 Today's Date: 09/26/2016    History of Present Illness admitted status post L THR (09/25/16), posterior THPs, WBAT.   Clinical Impression   Pt seen for OT evaluation this date. Pt was independent at baseline, with more recent PRN use of RW due to pain. Pt presents with L hip pain, decreased ROM/knowledge of precautions/activity tolerance and increased need for assist with self care tasks. Pt eager to return to PLOF. Pt will benefit from skilled OT services to address noted impairments and functional deficits including education/training with pt and boyfriend for AE/compensatory strategies for dressing/bathing, energy conservation strategies, and falls prevention education to maximize return to PLOF and minimize future falls/injury/rehospitalization.     Follow Up Recommendations  Home health OT    Equipment Recommendations  3 in 1 bedside commode    Recommendations for Other Services       Precautions / Restrictions Precautions Precautions: Fall;Posterior Hip Restrictions Weight Bearing Restrictions: Yes LLE Weight Bearing: Weight bearing as tolerated      Mobility Bed Mobility Overal bed mobility: Needs Assistance Bed Mobility: Supine to Sit     Supine to sit: Min assist;Mod assist     General bed mobility comments: assist for L LE position and scooting towards edge of bed  Transfers Overall transfer level: Needs assistance Equipment used: Rolling walker (2 wheeled) Transfers: Sit to/from Stand Sit to Stand: Min guard         General transfer comment: cues for hand placement and LLE alignment to maintain posterior hip precautions    Balance Overall balance assessment: Needs assistance Sitting-balance support: No upper extremity supported;Feet supported Sitting balance-Leahy Scale: Good     Standing balance support: Bilateral upper extremity supported;During  functional activity Standing balance-Leahy Scale: Fair                             ADL either performed or assessed with clinical judgement   ADL Overall ADL's : Needs assistance/impaired Eating/Feeding: Sitting;Set up   Grooming: Set up;Sitting   Upper Body Bathing: Set up;Supervision/ safety;Sitting   Lower Body Bathing: Minimal assistance;Sitting/lateral leans;Sit to/from stand   Upper Body Dressing : Set up;Sitting   Lower Body Dressing: Minimal assistance;Sitting/lateral leans;Sit to/from stand;With adaptive equipment Lower Body Dressing Details (indicate cue type and reason): cues for techniques, max assist for TED hose; min assist for socks             Functional mobility during ADLs: Min guard;Rolling walker (cues for hip precautions) General ADL Comments: generally min assist for LB ADL     Vision Baseline Vision/History: No visual deficits Patient Visual Report: No change from baseline Vision Assessment?: No apparent visual deficits     Perception     Praxis      Pertinent Vitals/Pain Pain Assessment: 0-10 Pain Score: 3  Pain Location: L hip Pain Descriptors / Indicators: Aching;Guarding Pain Intervention(s): Limited activity within patient's tolerance;Monitored during session;Premedicated before session     Hand Dominance Right   Extremity/Trunk Assessment Upper Extremity Assessment Upper Extremity Assessment: Overall WFL for tasks assessed   Lower Extremity Assessment Lower Extremity Assessment: Defer to PT evaluation;LLE deficits/detail       Communication Communication Communication: No difficulties   Cognition Arousal/Alertness: Awake/alert Behavior During Therapy: WFL for tasks assessed/performed Overall Cognitive Status: Within Functional Limits for tasks assessed  General Comments       Exercises Other Exercises Other Exercises: Pt educated in compensatory strategies  for compression stocking mgt; made plan to provide education/training tomorrow with pt and boyfriend as pt is concerned about this Other Exercises: pt educated in falls prevention strategies and energy conservation strategies to maximize safety/functional independence   Shoulder Instructions      Home Living Family/patient expects to be discharged to:: Private residence Living Arrangements: Spouse/significant other Available Help at Discharge: Family;Available 24 hours/day (plan to discharge to boyfriend's house) Type of Home: House Home Access: Stairs to enter [employer] of Steps: 1 Entrance Stairs-Rails: None Home Layout: One level     Bathroom Shower/Tub: Occupational psychologist: Standard (with rails) Bathroom Accessibility: Yes How Accessible: Accessible via walker Home Equipment: Reed - 2 wheels;Adaptive equipment Adaptive Equipment: Reacher;Sock aid        Prior Functioning/Environment Level of Independence: Independent        Comments: Indep with ADLs, household and community mobility; intermittent use of SPC/RW when pain in L hip bad.  1 fall in past 12 months, a couple previous falls prior to that        OT Problem List: Pain;Decreased range of motion;Decreased activity tolerance;Decreased knowledge of use of DME or AE;Decreased knowledge of precautions      OT Treatment/Interventions: Self-care/ADL training;Therapeutic exercise;Therapeutic activities;Energy conservation;DME and/or AE instruction;Patient/family education    OT Goals(Current goals can be found in the care plan section) Acute Rehab OT Goals Patient Stated Goal: get better OT Goal Formulation: With patient Time For Goal Achievement: 10/10/16 Potential to Achieve Goals: Good  OT Frequency: Min 1X/week   Barriers to D/C:            Co-evaluation              AM-PAC PT "6 Clicks" Daily Activity     Outcome Measure Help from another person eating meals?:  None Help from another person taking care of personal grooming?: None Help from another person toileting, which includes using toliet, bedpan, or urinal?: A Little Help from another person bathing (including washing, rinsing, drying)?: A Little Help from another person to put on and taking off regular upper body clothing?: None Help from another person to put on and taking off regular lower body clothing?: A Little 6 Click Score: 21   End of Session Equipment Utilized During Treatment: Gait belt;Rolling walker  Activity Tolerance: Patient tolerated treatment well Patient left: in chair;with call bell/phone within reach;with chair alarm set;with SCD's reapplied  OT Visit Diagnosis: Other abnormalities of gait and mobility (R26.89);History of falling (Z91.81);Pain Pain - Right/Left: Left Pain - part of body: Hip                Time: 7829-5621 OT Time Calculation (min): 54 min Charges:  OT General Charges $OT Visit: 1 Procedure OT Evaluation $OT Eval Low Complexity: 1 Procedure OT Treatments $Self Care/Home Management : 38-52 mins G-Codes:     Jeni Salles, MPH, MS, OTR/L ascom [phone] 09/26/16, 10:13 AM

## 2016-09-26 NOTE — Progress Notes (Signed)
Physician Discharge Summary  Patient ID: Shannon [name] MRN: [id number] DOB/AGE: Jan 30, 1951 66 y.o.  Admit date: 09/25/2016 Discharge date: 09/27/2016  Admission Diagnoses:  LEFT HIP OSTEOARTHRITIS   Discharge Diagnoses: Patient Active Problem List   Diagnosis Date Noted  . Status post total replacement of hip 09/25/2016    Past Medical History:  Diagnosis Date  . Anxiety   . Arthritis    Osteoarthritis  . Headache      Transfusion: No transfusions during this admission   Consultants (if any):   Discharged Condition: Improved  Hospital Course: Shannon [name] is an 66 y.o. female who was admitted 09/25/2016 with a diagnosis of degenerative arthrosis left hip and went to the operating room on 09/25/2016 and underwent the above named procedures.    Surgeries:Procedure(s): TOTAL HIP ARTHROPLASTY on 09/25/2016  PRE-OPERATIVE DIAGNOSIS: Degenerative arthrosis of the left hip, primary  POST-OPERATIVE DIAGNOSIS:  Same  PROCEDURE:  Left total hip arthroplasty  SURGEON:  Marciano Sequin. M.D.  ASSISTANT:  Vance Peper, PA (present and scrubbed throughout the case, critical for assistance with exposure, retraction, instrumentation, and closure)  ANESTHESIA: spinal  ESTIMATED BLOOD LOSS: 100 mL  FLUIDS REPLACED: 1700 mL of crystalloid  DRAINS: 2 medium drains to a Hemovac reservoir  IMPLANTS UTILIZED: DePuy 13.5 mm small stature AML femoral stem, 52 mm OD Pinnacle 100 acetabular component, +4 mm neutral Pinnacle Marathon polyethylene insert, and a 36 mm M-SPEC +1.5 mm hip ball  INDICATIONS FOR SURGERY: Shannon [name] is a 66 y.o. year old female with a long history of progressive hip and groin  pain. X-rays demonstrated severe degenerative changes. The patient had not seen any significant improvement despite conservative nonsurgical intervention. After discussion of the risks and benefits of surgical intervention, the patient expressed understanding of the  risks benefits and agree with plans for total hip arthroplasty.   The risks, benefits, and alternatives were discussed at length including but not limited to the risks of infection, bleeding, nerve injury, stiffness, blood clots, the need for revision surgery, limb length inequality, dislocation, cardiopulmonary complications, among others, and they were willing to proceed.  Patient tolerated the surgery well. No complications .Patient was taken to PACU where she was stabilized and then transferred to the orthopedic floor.  Patient started on Lovenox 30 mg q 12 hrs. Foot pumps applied bilaterally at 80 mm hgb. Heels elevated off bed with rolled towels. No evidence of DVT. Calves non tender. Negative Homan. Physical therapy started on day #1 for gait training and transfer with OT starting on  day #1 for ADL and assisted devices. Patient has done well with therapy. Ambulated greater than 200 feet upon being discharged. Was able to ascend and descend 4 steps safely and independently  Patient's IV And Foley were discontinued on day #1 with Hemovac being discontinued on day #2. Dressing was changed on day 2 prior to patient being discharged   She was given perioperative antibiotics:  Anti-infectives    Start     Dose/Rate Route Frequency Ordered Stop   09/25/16 1215  ceFAZolin (ANCEF) 2 g in dextrose 5 % 100 mL IVPB     2 g 240 mL/hr over 30 Minutes Intravenous Every 6 hours 09/25/16 1201 09/26/16 0600   09/25/16 0600  ceFAZolin (ANCEF) IVPB 2g/100 mL premix     2 g 200 mL/hr over 30 Minutes Intravenous On call to O.R. 09/24/16 2308 09/25/16 0751    .  She was fitted with AV 1 compression foot  pump devices, instructed on heel pumps, early ambulation, and fitted with TED stockings bilaterally for DVT prophylaxis.  She benefited maximally from the hospital stay and there were no complications.    Recent vital signs:  Vitals:   09/25/16 1944 09/25/16 2333  BP: (!) 115/59 (!) 100/54  Pulse:  68 73  Resp: 18 18  Temp: 98 F (36.7 C) 98.7 F (37.1 C)    Recent laboratory studies:  Lab Results  Component Value Date   HGB 11.2 (L) 09/26/2016   HGB 13.3 09/14/2016   HGB 13.2 07/29/2015   Lab Results  Component Value Date   WBC 11.6 (H) 09/26/2016   PLT 273 09/26/2016   Lab Results  Component Value Date   INR 0.83 09/14/2016   Lab Results  Component Value Date   NA 134 (L) 09/26/2016   K 3.8 09/26/2016   CL 100 (L) 09/26/2016   CO2 26 09/26/2016   BUN 10 09/26/2016   CREATININE 0.72 09/26/2016   GLUCOSE 110 (H) 09/26/2016    Discharge Medications:   Allergies as of 09/27/2016      Reactions   Wellbutrin [bupropion] Itching   Zoloft [sertraline Hcl] Itching      Medication List    STOP taking these medications   diclofenac 75 MG EC tablet Commonly known as:  VOLTAREN     TAKE these medications   acetaminophen 500 MG tablet Commonly known as:  TYLENOL Take 500 mg by mouth every 6 (six) hours as needed for mild pain.   enoxaparin 30 MG/0.3ML injection Commonly known as:  LOVENOX Inject 0.4 mLs (40 mg total) into the skin daily.   EYE ITCH RELIEF OP Place 1 drop into both eyes daily.   fexofenadine 180 MG tablet Commonly known as:  ALLEGRA Take 180 mg by mouth daily as needed for allergies or rhinitis.   FIBER ADULT GUMMIES PO Take 1 tablet by mouth daily.   GLUCOSAMINE CHONDR COMPLEX PO Take 1 tablet by mouth daily.   Melatonin 3 MG Tabs Take 1.5 mg by mouth at bedtime as needed (sleep).   oxyCODONE 5 MG immediate release tablet Commonly known as:  Oxy IR/ROXICODONE Take 1-2 tablets (5-10 mg total) by mouth every 4 (four) hours as needed for severe pain.   pyridOXINE 100 MG tablet Commonly known as:  VITAMIN B-6 Take 50 mg by mouth 3 (three) times a week.   SOOTHE XP Soln Apply 1-2 drops to eye as needed (dry eye).   traMADol 50 MG tablet Commonly known as:  ULTRAM Take 0.5 tablets (25 mg total) by mouth every 4 (four) hours as  needed for moderate pain. What changed:  when to take this   Vitamin D3 2000 units capsule Take 2,000 Units by mouth daily.       Diagnostic Studies: Dg Hip Port Unilat With Pelvis 1v Left  Result Date: 09/25/2016 CLINICAL DATA:  Status post left total hip joint replacement. Post- operative radiographs. EXAM: DG HIP (WITH OR WITHOUT PELVIS) 1V PORT LEFT COMPARISON:  None in PACs FINDINGS: The patient has undergone placement of a left total hip joint prosthesis. Radiographic positioning of the prosthetic components is good. The interface with the native bone appears normal. Surgical drain lines and skin staples are present. There is a pre-existing right prosthetic hip joint. IMPRESSION: No immediate postprocedure complication following left total hip joint prosthesis placement. Electronically Signed   By: David  Martinique M.D.   On: 09/25/2016 11:10    Disposition: 01-Home or  Self Care    Follow-up Information    Dereck Leep, MD On 11/07/2016.   Specialty:  Orthopedic Surgery Why:  at 9:45am Contact information: [place] [address] [phone]            Signed: Watt Climes 09/26/2016, 7:32 AM

## 2016-09-26 NOTE — Progress Notes (Signed)
Physical Therapy Treatment Patient Details Name: ARDELLE HALIBURTON MRN: [id number] DOB: [birth year] Today's Date: 09/26/2016    History of Present Illness admitted status post L THR (09/25/16), posterior THPs, WBAT.    PT Comments    Initiates stair training with RW, cga/close sup; demonstrates good/safe technique without difficulty.  Good progression towards all mobility goals; demonstrates ability to safely negotiate home environment upon discharge.    Follow Up Recommendations  Home health PT     Equipment Recommendations  Rolling walker with 5" wheels;3in1 (PT)    Recommendations for Other Services       Precautions / Restrictions Precautions Precautions: Fall;Posterior Hip Restrictions Weight Bearing Restrictions: Yes LLE Weight Bearing: Weight bearing as tolerated    Mobility  Bed Mobility Overal bed mobility: Needs Assistance Bed Mobility: Sit to Supine     Supine to sit: Min assist     General bed mobility comments: assist for L LE management over edge of bed  Transfers Overall transfer level: Needs assistance Equipment used: Rolling walker (2 wheeled) Transfers: Sit to/from Stand Sit to Stand: Supervision         General transfer comment: good hand placement and L LE position (to maintain posterior THPs)  Ambulation/Gait Ambulation/Gait assistance: Min guard Ambulation Distance (Feet): 140 Feet (x2) Assistive device: Rolling walker (2 wheeled)       General Gait Details: gradual improvement in cadence, gait speed and overall postural extension   Stairs Stairs: Yes   Stair Management: No rails Number of Stairs: 1 General stair comments: up/down single platform step with RW, cga/close sup; good technique; good stability in periods of modified L LE SLS  Wheelchair Mobility    Modified Rankin (Stroke Patients Only)       Balance                                            Cognition Arousal/Alertness:  Awake/alert Behavior During Therapy: WFL for tasks assessed/performed Overall Cognitive Status: Within Functional Limits for tasks assessed                                        Exercises  Verbally reviewed car transfer technique; patient voiced understanding.    General Comments        Pertinent Vitals/Pain Pain Assessment: 0-10 Pain Score: 3  Pain Location: L hip Pain Descriptors / Indicators: Aching;Guarding Pain Intervention(s): Limited activity within patient's tolerance;Monitored during session;Repositioned    Home Living                      Prior Function            PT Goals (current goals can now be found in the care plan section) Acute Rehab PT Goals Patient Stated Goal: get better PT Goal Formulation: With patient Time For Goal Achievement: 10/09/16 Potential to Achieve Goals: Good    Frequency    BID      PT Plan Current plan remains appropriate    Co-evaluation              AM-PAC PT "6 Clicks" Daily Activity  Outcome Measure  Difficulty turning over in bed (including adjusting bedclothes, sheets and blankets)?: Total Difficulty moving from lying on back to sitting on the side of the bed? :  Total Difficulty sitting down on and standing up from a chair with arms (e.g., wheelchair, bedside commode, etc,.)?: A Little Help needed moving to and from a bed to chair (including a wheelchair)?: A Little Help needed walking in hospital room?: A Little Help needed climbing 3-5 steps with a railing? : A Little 6 Click Score: 14    End of Session Equipment Utilized During Treatment: Gait belt Activity Tolerance: Patient tolerated treatment well Patient left: in bed;with call bell/phone within reach;with bed alarm set   PT Visit Diagnosis: Pain;Muscle weakness (generalized) (M62.81);Difficulty in walking, not elsewhere classified (R26.2) Pain - Right/Left: Left Pain - part of body: Hip     Time: 6759-1638 PT Time  Calculation (min) (ACUTE ONLY): 17 min  Charges:  $Gait Training: 8-22 mins                    G Codes:      Bassam Dresch H. Owens Shark, PT, DPT, NCS 09/26/16, 6:16 PM [phone]

## 2016-09-26 NOTE — Progress Notes (Signed)
Clinical Social Worker (CSW) received SNF consult. PT is recommending home health. RN case manager aware of above. Please reconsult if future social work needs arise. CSW signing off.   Emelia Sandoval, LCSW [phone] 

## 2016-09-26 NOTE — Progress Notes (Signed)
   Subjective: 1 Day Post-Op Procedure(s) (LRB): TOTAL HIP ARTHROPLASTY (Left) Patient reports pain as 3 on 0-10 scale.   Patient is well, and has had no acute complaints or problems We will start therapy today.  Plan is to go Home after hospital stay. no nausea and no vomiting. Did have some nausea yesterday but this cleared quickly with medication. Patient denies any chest pains or shortness of breath. Slept fairly well during the night.  Objective: Vital signs in last 24 hours: Temp:  [96.9 F (36.1 C)-98.7 F (37.1 C)] 98.7 F (37.1 C) (05/14 2333) Pulse Rate:  [46-73] 73 (05/14 2333) Resp:  [11-18] 18 (05/14 2333) BP: (100-127)/(44-76) 100/54 (05/14 2333) SpO2:  [96 %-100 %] 96 % (05/14 2333) Weight:  [81 kg (178 lb 9.2 oz)] 81 kg (178 lb 9.2 oz) (05/14 1944) well approximated incision Heels are non tender and elevated off the bed using rolled towels Intake/Output from previous day: 05/14 0701 - 05/15 0700 In: 2410 [P.O.:500; I.V.:1800; IV Piggyback:110] Out: 2331 [Urine:2051; Drains:180; Blood:100] Intake/Output this shift: No intake/output data recorded.   Recent Labs  09/26/16 0608  HGB 11.2*    Recent Labs  09/26/16 0608  WBC 11.6*  RBC 3.91  HCT 33.6*  PLT 273    Recent Labs  09/26/16 0608  NA 134*  K 3.8  CL 100*  CO2 26  BUN 10  CREATININE 0.72  GLUCOSE 110*  CALCIUM 8.8*   No results for input(s): LABPT, INR in the last 72 hours.  EXAM General - Patient is Alert, Appropriate and Oriented Extremity - Neurologically intact Neurovascular intact Sensation intact distally Intact pulses distally Dorsiflexion/Plantar flexion intact No cellulitis present Compartment soft Dressing - dressing C/D/I Motor Function - intact, moving foot and toes well on exam.    Past Medical History:  Diagnosis Date  . Anxiety   . Arthritis    Osteoarthritis  . Headache     Assessment/Plan: 1 Day Post-Op Procedure(s) (LRB): TOTAL HIP ARTHROPLASTY  (Left) Active Problems:   Status post total replacement of hip  Estimated body mass index is 30.65 kg/m as calculated from the following:   Height as of this encounter: 5\' 4"  (1.626 m).   Weight as of this encounter: 81 kg (178 lb 9.2 oz). Advance diet Up with therapy D/C IV fluids Plan for discharge tomorrow Discharge home with home health  Labs: Were reviewed and acceptable DVT Prophylaxis - Lovenox, Foot Pumps and TED hose Weight-Bearing as tolerated to left leg D/C O2 and Pulse OX and try on Room Air Begin working on bowel movement. Labs tomorrow morning  Jillyn Ledger. [place] 09/26/2016, 7:34 AM

## 2016-09-26 NOTE — Care Management Note (Signed)
Case Management Note  Patient Details  Name: Shannon [name] MRN: [id number] Date of Birth: [birth year]  Subjective/Objective:   POD # 1 left total hip arthroplasty. Met with patient at bedside to discuss discharge planning.  She will be going to live at her boyfriends house while recovering...[address], [place], [place].  She has a walker but will need a bsc. Ordered from [place] with [facility]. Pharmacy: [facility]. [phone]. Called Lovenox 40 mg # 14 no refills.  PCP is [facility]. Offered choice of home health agencies. Referral to [facility] for HHPT.                   Action/Plan: [facility] for HHPT. [facility] for [facility]. Lovenox called in.   Expected Discharge Date:                  Expected Discharge Plan:  [place]  In-House Referral:     Discharge planning Services  CM Consult  Post Acute Care Choice:  Durable Medical Equipment, Home Health Choice offered to:  Patient  DME Arranged:  3-N-1 DME Agency:  [place]:  PT [place]:  [facility] ([facility])  Status of Service:  In process, will continue to follow  If discussed at Long Length of Stay Meetings, dates discussed:    Additional Comments:  Jolly Mango, RN 09/26/2016, 9:28 AM

## 2016-09-26 NOTE — Progress Notes (Signed)
Physical Therapy Treatment Patient Details Name: Shannon [name] MRN: [id number] DOB: April 24, 1951 Today's Date: 09/26/2016    History of Present Illness admitted status post L THR (09/25/16), posterior THPs, WBAT.    PT Comments    Able to complete gait around nursing station with RW, no greater than cga level of assist.  Good stance time, weight acceptance and stability on  L LE; no episodes of buckling or LOB.  Min cuing for postural extension; tends to maintain slight L hip flexion throughout gait cycle.  Demonstrates good awareness and adherence to posterior THPs with all transfers and mobility. Will plan to initiate stair training this afternoon as appropriate.    Follow Up Recommendations  Home health PT     Equipment Recommendations  Rolling walker with 5" wheels;3in1 (PT)    Recommendations for Other Services       Precautions / Restrictions Precautions Precautions: Fall;Posterior Hip Restrictions Weight Bearing Restrictions: Yes LLE Weight Bearing: Weight bearing as tolerated    Mobility  Bed Mobility Overal bed mobility: Needs Assistance Bed Mobility: Supine to Sit     Supine to sit: Min assist;Mod assist     General bed mobility comments: seated on BSC beginning of session, recliner end of session  Transfers Overall transfer level: Needs assistance Equipment used: Rolling walker (2 wheeled) Transfers: Sit to/from Stand Sit to Stand: Min guard;Supervision         General transfer comment: good hand placement and L LE position (to maintain posterior THPs)  Ambulation/Gait Ambulation/Gait assistance: Min guard Ambulation Distance (Feet): 220 Feet Assistive device: Rolling walker (2 wheeled)   Gait velocity: 10' walk time, 12-13 seconds   General Gait Details: reciprocal stepping pattern with good stance time, weight shift to L LE.  Slow and guarded, but stable without buckling or LOB   Stairs            Wheelchair Mobility    Modified  Rankin (Stroke Patients Only)       Balance Overall balance assessment: Needs assistance Sitting-balance support: No upper extremity supported;Feet supported Sitting balance-Leahy Scale: Normal     Standing balance support: Bilateral upper extremity supported Standing balance-Leahy Scale: Good                              Cognition Arousal/Alertness: Awake/alert Behavior During Therapy: WFL for tasks assessed/performed Overall Cognitive Status: Within Functional Limits for tasks assessed                                        Exercises Other Exercises Other Exercises: Seated LE therex, 1x15, AROM for muscular strength/endurance; good awareness of THPs with therex and functional activities Other Exercises: pt educated in falls prevention strategies and energy conservation strategies to maximize safety/functional independence    General Comments        Pertinent Vitals/Pain Pain Assessment: 0-10 Pain Score: 3  Pain Location: L hip Pain Descriptors / Indicators: Aching;Guarding Pain Intervention(s): Limited activity within patient's tolerance;Monitored during session;Repositioned;Premedicated before session    Home Living Family/patient expects to be discharged to:: Private residence Living Arrangements: Spouse/significant other Available Help at Discharge: Family;Available 24 hours/day (plan to discharge to boyfriend's house) Type of Home: House Home Access: Stairs to enter Entrance Stairs-Rails: None Home Layout: One level Home Equipment: Environmental consultant - 2 wheels;Adaptive equipment      Prior Function  Level of Independence: Independent      Comments: Indep with ADLs, household and community mobility; intermittent use of SPC/RW when pain in L hip bad.  1 fall in past 12 months, a couple previous falls prior to that   PT Goals (current goals can now be found in the care plan section) Acute Rehab PT Goals Patient Stated Goal: get better PT Goal  Formulation: With patient Time For Goal Achievement: 10/09/16 Potential to Achieve Goals: Good Progress towards PT goals: Progressing toward goals    Frequency    BID      PT Plan Current plan remains appropriate    Co-evaluation              AM-PAC PT "6 Clicks" Daily Activity  Outcome Measure  Difficulty turning over in bed (including adjusting bedclothes, sheets and blankets)?: Total Difficulty moving from lying on back to sitting on the side of the bed? : Total Difficulty sitting down on and standing up from a chair with arms (e.g., wheelchair, bedside commode, etc,.)?: Total Help needed moving to and from a bed to chair (including a wheelchair)?: A Little Help needed walking in hospital room?: A Little Help needed climbing 3-5 steps with a railing? : A Little 6 Click Score: 12    End of Session Equipment Utilized During Treatment: Gait belt Activity Tolerance: Patient tolerated treatment well Patient left: in chair;with call bell/phone within reach;with chair alarm set   PT Visit Diagnosis: Pain;Muscle weakness (generalized) (M62.81);Difficulty in walking, not elsewhere classified (R26.2) Pain - Right/Left: Left Pain - part of body: Hip     Time: 7290-2111 PT Time Calculation (min) (ACUTE ONLY): 25 min  Charges:  $Gait Training: 8-22 mins $Therapeutic Exercise: 8-22 mins                    G Codes:      Shaunie Boehm H. Owens Shark, PT, DPT, NCS 09/26/16, 11:52 AM [phone]

## 2016-09-27 LAB — SURGICAL PATHOLOGY

## 2016-09-27 LAB — CBC
HCT: 30.7 % — ABNORMAL LOW (ref 35.0–47.0)
Hemoglobin: 10.4 g/dL — ABNORMAL LOW (ref 12.0–16.0)
MCH: 28.1 pg (ref 26.0–34.0)
MCHC: 33.8 g/dL (ref 32.0–36.0)
MCV: 83.3 fL (ref 80.0–100.0)
PLATELETS: 227 10*3/uL (ref 150–440)
RBC: 3.69 MIL/uL — ABNORMAL LOW (ref 3.80–5.20)
RDW: 14.7 % — AB (ref 11.5–14.5)
WBC: 9.8 10*3/uL (ref 3.6–11.0)

## 2016-09-27 LAB — BASIC METABOLIC PANEL
ANION GAP: 6 (ref 5–15)
BUN: 9 mg/dL (ref 6–20)
CALCIUM: 9 mg/dL (ref 8.9–10.3)
CO2: 27 mmol/L (ref 22–32)
CREATININE: 0.59 mg/dL (ref 0.44–1.00)
Chloride: 103 mmol/L (ref 101–111)
GFR calc non Af Amer: >60 mL/min (ref >60)
Glucose, Bld: 110 mg/dL — ABNORMAL HIGH (ref 65–99)
Potassium: 3.6 mmol/L (ref 3.5–5.1)
SODIUM: 136 mmol/L (ref 135–145)

## 2016-09-27 MED ORDER — OXYCODONE HCL 5 MG PO TABS: 5.0000 mg | ORAL_TABLET | ORAL | 0 refills | Status: DC | PRN

## 2016-09-27 MED ORDER — LACTULOSE 10 GM/15ML PO SOLN
10.0000 g | Freq: Two times a day (BID) | ORAL | Status: DC
  Administered 2016-09-27: 10 g via ORAL
  Filled 2016-09-27: qty 30

## 2016-09-27 MED ORDER — ENOXAPARIN SODIUM 30 MG/0.3ML ~~LOC~~ SOLN: 40.0000 mg | SUBCUTANEOUS | 0 refills | Status: DC

## 2016-09-27 MED ORDER — TRAMADOL HCL 50 MG PO TABS: 25.0000 mg | ORAL_TABLET | ORAL | 0 refills | Status: DC | PRN

## 2016-09-27 NOTE — Care Management Note (Addendum)
Case Management Note  Patient Details  Name: Shannon [name] MRN: [id number] Date of Birth: 02-May-1951  Subjective/Objective:  Discharging today                  Action/Plan: Cost of Lovenox is $ 10.00 . Patient updated [facility] notified of discharge. Bedside delivered  Expected Discharge Date:  09/27/16               Expected Discharge Plan:  [place]  In-House Referral:     Discharge planning Services  CM Consult  Post Acute Care Choice:  Durable Medical Equipment, Home Health Choice offered to:  Patient  DME Arranged:  3-N-1 DME Agency:  [place]:  PT [place]:  [facility] (now [facility])  Status of Service:  Completed, signed off  If discussed at [place] of Stay Meetings, dates discussed:    Additional Comments:  Jolly Mango, RN 09/27/2016, 10:01 AM

## 2016-09-27 NOTE — Discharge Instructions (Signed)

## 2016-09-27 NOTE — Progress Notes (Signed)
Physical Therapy Treatment Patient Details Name: Shannon [name] MRN: [id number] DOB: 06/10/1950 Today's Date: 09/27/2016    History of Present Illness admitted status post L THR (09/25/16), posterior THPs, WBAT.    PT Comments    Patient with excellent progress in all areas of functional mobility; mod indep for sit/stand, basic transfers and gait (220') with RW.  Has met all goals for acute rehab course at this time.  Will continue to benefit from higher-level strength/balance training to promote optimal functional indep upon discharge from acute hospital. Pending discharge this PM; voices comfort/confidence with functional status and ability to negotiate home environment.   Follow Up Recommendations  Home health PT     Equipment Recommendations  Rolling walker with 5" wheels;3in1 (PT)    Recommendations for Other Services       Precautions / Restrictions Precautions Precautions: Fall;Posterior Hip Restrictions Weight Bearing Restrictions: Yes LLE Weight Bearing: Weight bearing as tolerated    Mobility  Bed Mobility Overal bed mobility: Modified Independent Bed Mobility: Supine to Sit     Supine to sit: Modified independent (Device/Increase time)     General bed mobility comments: additional time but no assist needed and maintained hip precautions  Transfers Overall transfer level: Modified independent Equipment used: Rolling walker (2 wheeled) Transfers: Sit to/from Stand Sit to Stand: Supervision         General transfer comment: good hand placement and LLE positioning while maintaining posterior THPs  Ambulation/Gait Ambulation/Gait assistance: Modified independent (Device/Increase time) Ambulation Distance (Feet): 220 Feet Assistive device: Rolling walker (2 wheeled)   Gait velocity: 10' walk time, 10 seconds (improved from previous session)   General Gait Details: reciprocal stepping pattern with near symmetry in step height/length and stance time;  much more fluid, confident gait pattern with improved cadence/gait speed   Stairs            Wheelchair Mobility    Modified Rankin (Stroke Patients Only)       Balance Overall balance assessment: Needs assistance Sitting-balance support: No upper extremity supported;Feet supported Sitting balance-Leahy Scale: Normal     Standing balance support: Bilateral upper extremity supported Standing balance-Leahy Scale: Good                              Cognition Arousal/Alertness: Awake/alert Behavior During Therapy: WFL for tasks assessed/performed Overall Cognitive Status: Within Functional Limits for tasks assessed                                        Exercises Other Exercises Other Exercises: Standing LE therex, 1x10, AROM with RW: heel raises, mini squats, marching, hip flex/ext/abduct/adduct.  Good stability and WBing L LE; good hip control Other Exercises: Verbally reviewed THPs and functional implications; indep reviews 2/3, 3/3 with cuing from therapist.  Verbalized understanding of all information.    General Comments        Pertinent Vitals/Pain Pain Assessment: 0-10 Pain Score: 2  Pain Location: L hip Pain Descriptors / Indicators: Aching;Guarding Pain Intervention(s): Limited activity within patient's tolerance;Monitored during session;Repositioned    Home Living                      Prior Function            PT Goals (current goals can now be found in the care plan section)  Acute Rehab PT Goals Patient Stated Goal: get better PT Goal Formulation: With patient Time For Goal Achievement: 10/09/16 Potential to Achieve Goals: Good Progress towards PT goals: Progressing toward goals    Frequency    BID      PT Plan Current plan remains appropriate    Co-evaluation              AM-PAC PT "6 Clicks" Daily Activity  Outcome Measure  Difficulty turning over in bed (including adjusting bedclothes,  sheets and blankets)?: A Little Difficulty moving from lying on back to sitting on the side of the bed? : Total Difficulty sitting down on and standing up from a chair with arms (e.g., wheelchair, bedside commode, etc,.)?: None Help needed moving to and from a bed to chair (including a wheelchair)?: None Help needed walking in hospital room?: None Help needed climbing 3-5 steps with a railing? : A Little 6 Click Score: 19    End of Session Equipment Utilized During Treatment: Gait belt Activity Tolerance: Patient tolerated treatment well Patient left: in chair;with call bell/phone within reach;with chair alarm set;with family/visitor present Nurse Communication: Mobility status PT Visit Diagnosis: Pain;Muscle weakness (generalized) (M62.81);Difficulty in walking, not elsewhere classified (R26.2) Pain - Right/Left: Left Pain - part of body: Hip     Time: 1027-1040 PT Time Calculation (min) (ACUTE ONLY): 13 min  Charges:  $Gait Training: 8-22 mins                    G Codes:       Melida Northington H. Owens Shark, PT, DPT, NCS 09/27/16, 11:21 AM [phone]

## 2016-09-27 NOTE — Discharge Summary (Signed)
Physician Discharge Summary  Patient ID: Shannon [name] MRN: [id number] DOB/AGE: 1951-05-07 66 y.o.  Admit date: 09/25/2016 Discharge date: 09/27/2016  Admission Diagnoses:  LEFT HIP OSTEOARTHRITIS   Discharge Diagnoses: Patient Active Problem List   Diagnosis Date Noted  . Status post total replacement of hip 09/25/2016    Past Medical History:  Diagnosis Date  . Anxiety   . Arthritis    Osteoarthritis  . Headache      Transfusion: No transfusions given doing this admission   Consultants (if any):   Discharged Condition: Improved  Hospital Course: Shannon [name] is an 66 y.o. female who was admitted 09/25/2016 with a diagnosis of degenerative arthrosis left hip and went to the operating room on 09/25/2016 and underwent the above named procedures.    Surgeries:Procedure(s): TOTAL HIP ARTHROPLASTY on 09/25/2016  PRE-OPERATIVE DIAGNOSIS: Degenerative arthrosis of the left hip, primary  POST-OPERATIVE DIAGNOSIS:  Same  PROCEDURE:  Left total hip arthroplasty  SURGEON:  Marciano Sequin. M.D.  ASSISTANT:  Vance Peper, PA (present and scrubbed throughout the case, critical for assistance with exposure, retraction, instrumentation, and closure)  ANESTHESIA: spinal  ESTIMATED BLOOD LOSS: 100 mL  FLUIDS REPLACED: 1700 mL of crystalloid  DRAINS: 2 medium drains to a Hemovac reservoir  IMPLANTS UTILIZED: DePuy 13.5 mm small stature AML femoral stem, 52 mm OD Pinnacle 100 acetabular component, +4 mm neutral Pinnacle Marathon polyethylene insert, and a 36 mm M-SPEC +1.5 mm hip ball  INDICATIONS FOR SURGERY: Shannon [name] is a 66 y.o. year old female with a long history of progressive hip and groin  pain. X-rays demonstrated severe degenerative changes. The patient had not seen any significant improvement despite conservative nonsurgical intervention. After discussion of the risks and benefits of surgical intervention, the patient expressed understanding of  the risks benefits and agree with plans for total hip arthroplasty.   The risks, benefits, and alternatives were discussed at length including but not limited to the risks of infection, bleeding, nerve injury, stiffness, blood clots, the need for revision surgery, limb length inequality, dislocation, cardiopulmonary complications, among others, and they were willing to proceed. Patient tolerated the surgery well. No complications .Patient was taken to PACU where she was stabilized and then transferred to the orthopedic floor.  Patient started on Lovenox 30 mg q 12 hrs. Foot pumps applied bilaterally at 80 mm hgb. Heels elevated off bed with rolled towels. No evidence of DVT. Calves non tender. Negative Homan. Physical therapy started on day #1 for gait training and transfer with OT starting on  day #1 for ADL and assisted devices. Patient has done well with therapy. Ambulated greater than 200 feet upon being discharged.  Patient's IV And Foley were discontinued on day #1 with Hemovac being discontinued on day #2. Dressing was changed on day 2 prior to patient being discharged was able to ascend and descend 4 steps safely and independently   She was given perioperative antibiotics:  Anti-infectives    Start     Dose/Rate Route Frequency Ordered Stop   09/25/16 1215  ceFAZolin (ANCEF) 2 g in dextrose 5 % 100 mL IVPB     2 g 240 mL/hr over 30 Minutes Intravenous Every 6 hours 09/25/16 1201 09/26/16 0600   09/25/16 0600  ceFAZolin (ANCEF) IVPB 2g/100 mL premix     2 g 200 mL/hr over 30 Minutes Intravenous On call to O.R. 09/24/16 2308 09/25/16 0751    .  She was fitted with AV 1 compression foot  pump devices, instructed on heel pumps, early ambulation, and fitted with TED stockings bilaterally for DVT prophylaxis.  She benefited maximally from the hospital stay and there were no complications.    Recent vital signs:  Vitals:   09/26/16 1309 09/27/16 0040  BP: (!) 105/55 (!) 104/47  Pulse:  76 79  Resp: 16 16  Temp: 98.9 F (37.2 C) 98.9 F (37.2 C)    Recent laboratory studies:  Lab Results  Component Value Date   HGB 10.4 (L) 09/27/2016   HGB 11.2 (L) 09/26/2016   HGB 13.3 09/14/2016   Lab Results  Component Value Date   WBC 9.8 09/27/2016   PLT 227 09/27/2016   Lab Results  Component Value Date   INR 0.83 09/14/2016   Lab Results  Component Value Date   NA 136 09/27/2016   K 3.6 09/27/2016   CL 103 09/27/2016   CO2 27 09/27/2016   BUN 9 09/27/2016   CREATININE 0.59 09/27/2016   GLUCOSE 110 (H) 09/27/2016    Discharge Medications:   Allergies as of 09/27/2016      Reactions   Wellbutrin [bupropion] Itching   Zoloft [sertraline Hcl] Itching      Medication List    STOP taking these medications   diclofenac 75 MG EC tablet Commonly known as:  VOLTAREN     TAKE these medications   acetaminophen 500 MG tablet Commonly known as:  TYLENOL Take 500 mg by mouth every 6 (six) hours as needed for mild pain.   enoxaparin 30 MG/0.3ML injection Commonly known as:  LOVENOX Inject 0.4 mLs (40 mg total) into the skin daily.   EYE ITCH RELIEF OP Place 1 drop into both eyes daily.   fexofenadine 180 MG tablet Commonly known as:  ALLEGRA Take 180 mg by mouth daily as needed for allergies or rhinitis.   FIBER ADULT GUMMIES PO Take 1 tablet by mouth daily.   GLUCOSAMINE CHONDR COMPLEX PO Take 1 tablet by mouth daily.   Melatonin 3 MG Tabs Take 1.5 mg by mouth at bedtime as needed (sleep).   oxyCODONE 5 MG immediate release tablet Commonly known as:  Oxy IR/ROXICODONE Take 1-2 tablets (5-10 mg total) by mouth every 4 (four) hours as needed for severe pain.   pyridOXINE 100 MG tablet Commonly known as:  VITAMIN B-6 Take 50 mg by mouth 3 (three) times a week.   SOOTHE XP Soln Apply 1-2 drops to eye as needed (dry eye).   traMADol 50 MG tablet Commonly known as:  ULTRAM Take 0.5 tablets (25 mg total) by mouth every 4 (four) hours as needed  for moderate pain. What changed:  when to take this   Vitamin D3 2000 units capsule Take 2,000 Units by mouth daily.            Durable Medical Equipment        Start     Ordered   09/25/16 1202  DME Walker rolling  Once    Question:  Patient needs a walker to treat with the following condition  Answer:  S/P total hip arthroplasty   09/25/16 1201   09/25/16 1202  DME Bedside commode  Once    Question:  Patient needs a bedside commode to treat with the following condition  Answer:  S/P total hip arthroplasty   09/25/16 1201      Diagnostic Studies: Dg Hip Port Unilat With Pelvis 1v Left  Result Date: 09/25/2016 CLINICAL DATA:  Status post left total hip  joint replacement. Post- operative radiographs. EXAM: DG HIP (WITH OR WITHOUT PELVIS) 1V PORT LEFT COMPARISON:  None in PACs FINDINGS: The patient has undergone placement of a left total hip joint prosthesis. Radiographic positioning of the prosthetic components is good. The interface with the native bone appears normal. Surgical drain lines and skin staples are present. There is a pre-existing right prosthetic hip joint. IMPRESSION: No immediate postprocedure complication following left total hip joint prosthesis placement. Electronically Signed   By: David  Martinique M.D.   On: 09/25/2016 11:10    Disposition: 01-Home or Self Care  Discharge Instructions    Diet - low sodium heart healthy    Complete by:  As directed    Increase activity slowly    Complete by:  As directed       Follow-up Information    Hooten, Laurice Record, MD On 11/07/2016.   Specialty:  Orthopedic Surgery Why:  at 9:45am Contact information: [place] [address] [phone]            Signed: Watt Climes. 09/27/2016, 7:04 AM

## 2016-09-27 NOTE — Progress Notes (Signed)
Occupational Therapy Treatment Patient Details Name: Shannon [name] MRN: [id number] DOB: [birth year] Today's Date: 09/27/2016    History of present illness admitted status post L THR (09/25/16), posterior THPs, WBAT.   OT comments  Pt seen for OT treatment session this morning with boyfriend present. Pt/boyfriend educated in strategies to support TED hose management. Boyfriend able to demonstrate learned techniques to safety don/doff TED hose with good supportive body positioning for both himself and pt, while pt maintained posterior THPs. Pt and boyfriend report feeling confident in ability to manage these at home. Continue to progress.   Follow Up Recommendations  Home health OT    Equipment Recommendations  3 in 1 bedside commode    Recommendations for Other Services      Precautions / Restrictions Precautions Precautions: Fall;Posterior Hip Restrictions Weight Bearing Restrictions: Yes LLE Weight Bearing: Weight bearing as tolerated       Mobility Bed Mobility Overal bed mobility: Modified Independent Bed Mobility: Supine to Sit     Supine to sit: Modified independent (Device/Increase time)     General bed mobility comments: additional time but no assist needed and maintained hip precautions  Transfers Overall transfer level: Needs assistance Equipment used: Rolling walker (2 wheeled) Transfers: Sit to/from Stand Sit to Stand: Supervision         General transfer comment: good hand placement and LLE positioning while maintaining posterior THPs    Balance Overall balance assessment: Needs assistance Sitting-balance support: No upper extremity supported;Feet supported Sitting balance-Leahy Scale: Normal     Standing balance support: Bilateral upper extremity supported Standing balance-Leahy Scale: Good                             ADL either performed or assessed with clinical judgement   ADL Overall ADL's : Needs assistance/impaired                      Lower Body Dressing: With caregiver independent assisting;Adhering to hip precautions;Sitting/lateral leans;Cueing for compensatory techniques;Modified independent Lower Body Dressing Details (indicate cue type and reason): Educated pt/boyfriend in strategies to support TED hose mgt; boyfriend able to demonstrate learned techniques to safety don/doff TED hose with good supportive body positioning while pt maintained hip precautions; pt and boyfriend report feeling confident in ability to manage at home                     Vision Baseline Vision/History: No visual deficits Patient Visual Report: No change from baseline Vision Assessment?: No apparent visual deficits   Perception     Praxis      Cognition Arousal/Alertness: Awake/alert Behavior During Therapy: WFL for tasks assessed/performed Overall Cognitive Status: Within Functional Limits for tasks assessed                                          Exercises     Shoulder Instructions       General Comments      Pertinent Vitals/ Pain       Pain Assessment: 0-10 Pain Score: 4  Pain Location: L hip Pain Intervention(s): Limited activity within patient's tolerance;Monitored during session;Repositioned  Home Living  Prior Functioning/Environment              Frequency  Min 1X/week        Progress Toward Goals  OT Goals(current goals can now be found in the care plan section)  Progress towards OT goals: Progressing toward goals  Acute Rehab OT Goals Patient Stated Goal: get better OT Goal Formulation: With patient Time For Goal Achievement: 10/10/16 Potential to Achieve Goals: Good  Plan Discharge plan remains appropriate;Frequency remains appropriate    Co-evaluation                 AM-PAC PT "6 Clicks" Daily Activity     Outcome Measure   Help from another person eating meals?: None Help  from another person taking care of personal grooming?: None Help from another person toileting, which includes using toliet, bedpan, or urinal?: A Little Help from another person bathing (including washing, rinsing, drying)?: A Little Help from another person to put on and taking off regular upper body clothing?: None Help from another person to put on and taking off regular lower body clothing?: A Little 6 Click Score: 21    End of Session Equipment Utilized During Treatment: Gait belt;Rolling walker  OT Visit Diagnosis: Other abnormalities of gait and mobility (R26.89);History of falling (Z91.81);Pain Pain - Right/Left: Left Pain - part of body: Hip   Activity Tolerance Patient tolerated treatment well   Patient Left in chair;with call bell/phone within reach;with chair alarm set;with family/visitor present   Nurse Communication          Time: 4008-6761 OT Time Calculation (min): 31 min  Charges: OT General Charges $OT Visit: 1 Procedure OT Treatments $Self Care/Home Management : 23-37 mins  Jeni Salles, MPH, MS, OTR/L ascom [phone] 09/27/16, 10:35 AM

## 2016-09-27 NOTE — Progress Notes (Signed)
   Subjective: 2 Days Post-Op Procedure(s) (LRB): TOTAL HIP ARTHROPLASTY (Left) Patient reports pain as mild.   Patient is well, and has had no acute complaints or problems Patient did very well with therapy yesterday. No complaints Plan is to go Home after hospital stay. no nausea and no vomiting Patient denies any chest pains or shortness of breath. Objective: Vital signs in last 24 hours: Temp:  [97.5 F (36.4 C)-98.9 F (37.2 C)] 98.9 F (37.2 C) (05/16 0040) Pulse Rate:  [71-79] 79 (05/16 0040) Resp:  [16] 16 (05/16 0040) BP: (104-105)/(47-55) 104/47 (05/16 0040) SpO2:  [96 %-100 %] 96 % (05/16 0040) well approximated incision Heels are non tender and elevated off the bed using rolled towels Intake/Output from previous day: 05/15 0701 - 05/16 0700 In: 1080 [P.O.:1080] Out: 8 [Urine:3; Drains:5] Intake/Output this shift: No intake/output data recorded.   Recent Labs  09/26/16 0608 09/27/16 0548  HGB 11.2* 10.4*    Recent Labs  09/26/16 0608 09/27/16 0548  WBC 11.6* 9.8  RBC 3.91 3.69*  HCT 33.6* 30.7*  PLT 273 227    Recent Labs  09/26/16 0608 09/27/16 0548  NA 134* 136  K 3.8 3.6  CL 100* 103  CO2 26 27  BUN 10 9  CREATININE 0.72 0.59  GLUCOSE 110* 110*  CALCIUM 8.8* 9.0   No results for input(s): LABPT, INR in the last 72 hours.  EXAM General - Patient is Alert, Appropriate and Oriented Extremity - Neurologically intact Neurovascular intact Sensation intact distally Intact pulses distally Dorsiflexion/Plantar flexion intact No cellulitis present Compartment soft  Was noted to have a lot of bruising around the incision. Dressing - dressing C/D/I Motor Function - intact, moving foot and toes well on exam.    Past Medical History:  Diagnosis Date  . Anxiety   . Arthritis    Osteoarthritis  . Headache     Assessment/Plan: 2 Days Post-Op Procedure(s) (LRB): TOTAL HIP ARTHROPLASTY (Left) Active Problems:   Status post total  replacement of hip  Estimated body mass index is 30.65 kg/m as calculated from the following:   Height as of this encounter: 5\' 4"  (1.626 m).   Weight as of this encounter: 81 kg (178 lb 9.2 oz). Up with therapy Discharge home with home health  Labs: were reviewed and felt to be acceptable DVT Prophylaxis - Lovenox, Foot Pumps and TED hose Weight-Bearing as tolerated to left leg Hemovac was discontinued on today's visit. Patient needs a bowel movement prior to being discharged today. Please change dressing prior to being discharged and give the patient 2 extra honeycomb dressings to take with her. Be sure TED stockings are on both legs.  Jillyn Ledger. Rosebud Kipton 09/27/2016, 7:07 AM

## 2017-09-25 DIAGNOSIS — Z96641 Presence of right artificial hip joint: Secondary | ICD-10-CM | POA: Diagnosis not present

## 2017-09-25 DIAGNOSIS — Z96642 Presence of left artificial hip joint: Secondary | ICD-10-CM | POA: Diagnosis not present

## 2017-11-22 DIAGNOSIS — H25813 Combined forms of age-related cataract, bilateral: Secondary | ICD-10-CM | POA: Diagnosis not present

## 2018-01-04 DIAGNOSIS — H02813 Retained foreign body in right eye, unspecified eyelid: Secondary | ICD-10-CM | POA: Diagnosis not present

## 2018-01-04 DIAGNOSIS — H2513 Age-related nuclear cataract, bilateral: Secondary | ICD-10-CM | POA: Diagnosis not present

## 2018-01-04 DIAGNOSIS — H25043 Posterior subcapsular polar age-related cataract, bilateral: Secondary | ICD-10-CM | POA: Diagnosis not present

## 2018-01-04 DIAGNOSIS — H25013 Cortical age-related cataract, bilateral: Secondary | ICD-10-CM | POA: Diagnosis not present

## 2018-01-04 DIAGNOSIS — H2512 Age-related nuclear cataract, left eye: Secondary | ICD-10-CM | POA: Diagnosis not present

## 2018-01-04 DIAGNOSIS — H18413 Arcus senilis, bilateral: Secondary | ICD-10-CM | POA: Diagnosis not present

## 2018-02-07 DIAGNOSIS — H2513 Age-related nuclear cataract, bilateral: Secondary | ICD-10-CM | POA: Diagnosis not present

## 2018-02-07 DIAGNOSIS — H25043 Posterior subcapsular polar age-related cataract, bilateral: Secondary | ICD-10-CM | POA: Diagnosis not present

## 2018-02-07 DIAGNOSIS — H2512 Age-related nuclear cataract, left eye: Secondary | ICD-10-CM | POA: Diagnosis not present

## 2018-02-07 DIAGNOSIS — H25013 Cortical age-related cataract, bilateral: Secondary | ICD-10-CM | POA: Diagnosis not present

## 2018-03-04 DIAGNOSIS — H2512 Age-related nuclear cataract, left eye: Secondary | ICD-10-CM | POA: Diagnosis not present

## 2018-03-05 DIAGNOSIS — H25041 Posterior subcapsular polar age-related cataract, right eye: Secondary | ICD-10-CM | POA: Diagnosis not present

## 2018-03-05 DIAGNOSIS — H2511 Age-related nuclear cataract, right eye: Secondary | ICD-10-CM | POA: Diagnosis not present

## 2018-03-05 DIAGNOSIS — H25011 Cortical age-related cataract, right eye: Secondary | ICD-10-CM | POA: Diagnosis not present

## 2018-04-06 DIAGNOSIS — R69 Illness, unspecified: Secondary | ICD-10-CM | POA: Diagnosis not present

## 2018-04-15 DIAGNOSIS — H2511 Age-related nuclear cataract, right eye: Secondary | ICD-10-CM | POA: Diagnosis not present

## 2018-09-26 DIAGNOSIS — M1612 Unilateral primary osteoarthritis, left hip: Secondary | ICD-10-CM | POA: Diagnosis not present

## 2018-09-26 DIAGNOSIS — Z96643 Presence of artificial hip joint, bilateral: Secondary | ICD-10-CM | POA: Diagnosis not present

## 2018-09-26 DIAGNOSIS — M1611 Unilateral primary osteoarthritis, right hip: Secondary | ICD-10-CM | POA: Diagnosis not present

## 2018-09-26 DIAGNOSIS — M5416 Radiculopathy, lumbar region: Secondary | ICD-10-CM | POA: Diagnosis not present

## 2018-11-08 ENCOUNTER — Ambulatory Visit: Payer: Medicare HMO | Admitting: Internal Medicine

## 2018-12-05 ENCOUNTER — Other Ambulatory Visit: Payer: Self-pay

## 2018-12-05 ENCOUNTER — Emergency Department
Admission: EM | Admit: 2018-12-05 | Discharge: 2018-12-06 | Disposition: A | Discharge status: Home / Self Care | Payer: Medicare HMO | Attending: Emergency Medicine | Admitting: Emergency Medicine

## 2018-12-05 DIAGNOSIS — Z96643 Presence of artificial hip joint, bilateral: Secondary | ICD-10-CM | POA: Diagnosis not present

## 2018-12-05 DIAGNOSIS — T63001A Toxic effect of unspecified snake venom, accidental (unintentional), initial encounter: Secondary | ICD-10-CM | POA: Diagnosis not present

## 2018-12-05 DIAGNOSIS — Z87891 Personal history of nicotine dependence: Secondary | ICD-10-CM | POA: Insufficient documentation

## 2018-12-05 DIAGNOSIS — R110 Nausea: Secondary | ICD-10-CM | POA: Diagnosis not present

## 2018-12-05 DIAGNOSIS — Z79899 Other long term (current) drug therapy: Secondary | ICD-10-CM | POA: Diagnosis not present

## 2018-12-05 DIAGNOSIS — R2241 Localized swelling, mass and lump, right lower limb: Secondary | ICD-10-CM | POA: Diagnosis not present

## 2018-12-05 DIAGNOSIS — S91351A Open bite, right foot, initial encounter: Secondary | ICD-10-CM | POA: Diagnosis not present

## 2018-12-05 DIAGNOSIS — W5911XA Bitten by nonvenomous snake, initial encounter: Secondary | ICD-10-CM

## 2018-12-05 NOTE — ED Notes (Addendum)
Patient believes she had a snake bite last night around 12/04/2018 at 2140hrs. She is unable to confirm that it was a snake or what type of snake. Patient has red bruise on interior of right ankle that she believes is a bite mark.  L foot = 8.6" R foot = 9.1" L ankle = 8.85" R ankle = 9.9" L calf = 14.75 R calf = 15" L thigh = 20.625" R thigh = 20.625"

## 2018-12-05 NOTE — ED Provider Notes (Signed)
[facility] Emergency Department Provider Note  ____________________________________________  Time seen: Approximately 11:38 PM  I have reviewed the triage vital signs and the nursing notes.   HISTORY  Chief Complaint Animal Bite   HPI Shannon [name] is a 68 y.o. female with history of anxiety and arthritis who presents for evaluation of a possible snake bite.  The incident happened more than 24 hours ago.  Patient reports that she was walking bare feet on a gravel path around her house yesterday evening trying to see the comment when she felt something sharp on her right foot.  This morning when she woke up she noted bruising and some swelling of her right foot which prompted her to go to urgent care.  She was then sent over here for evaluation.  She reports having some mild nausea during the day today but no shortness of breath, no angioedema, no vomiting.  She has no pain with the extremity elevated but has moderate sharp pain with weightbearing.  Tetanus shot is up-to-date with the last one being 3 years ago.   Past Medical History:  Diagnosis Date  . Anxiety   . Arthritis    Osteoarthritis  . Headache     Patient Active Problem List   Diagnosis Date Noted  . Status post total replacement of hip 09/25/2016    Past Surgical History:  Procedure Laterality Date  . CERVICAL CERCLAGE      X 3  . JOINT REPLACEMENT    . TOTAL HIP ARTHROPLASTY Right 08/24/2014   Dr. Marry Guan, [facility]  . TOTAL HIP ARTHROPLASTY Left 09/25/2016   Procedure: TOTAL HIP ARTHROPLASTY;  Surgeon: Dereck Leep, MD;  Location: [facility] ORS;  Service: Orthopedics;  Laterality: Left;    Prior to Admission medications   Medication Sig Start Date End Date Taking? Authorizing Provider  acetaminophen (TYLENOL) 500 MG tablet Take 500 mg by mouth every 6 (six) hours as needed for mild pain.    [provider]  Artificial Tear Solution (SOOTHE XP) SOLN Apply 1-2 drops to eye as  needed (dry eye).    [provider]  Cholecalciferol (VITAMIN D3) 2000 units capsule Take 2,000 Units by mouth daily.    [provider]  enoxaparin (LOVENOX) 30 MG/0.3ML injection Inject 0.4 mLs (40 mg total) into the skin daily. 09/27/16   Watt Climes, PA  fexofenadine (ALLEGRA) 180 MG tablet Take 180 mg by mouth daily as needed for allergies or rhinitis.    [provider]  FIBER ADULT GUMMIES PO Take 1 tablet by mouth daily.    [provider]  Glucosamine-Chondroitin (GLUCOSAMINE CHONDR COMPLEX PO) Take 1 tablet by mouth daily.    [provider]  Ketotifen Fumarate (EYE ITCH RELIEF OP) Place 1 drop into both eyes daily.    [provider]  Melatonin 3 MG TABS Take 1.5 mg by mouth at bedtime as needed (sleep).    [provider]  oxyCODONE (OXY IR/ROXICODONE) 5 MG immediate release tablet Take 1-2 tablets (5-10 mg total) by mouth every 4 (four) hours as needed for severe pain. 09/27/16   Watt Climes, PA  pyridOXINE (VITAMIN B-6) 100 MG tablet Take 50 mg by mouth 3 (three) times a week.    [provider]  traMADol (ULTRAM) 50 MG tablet Take 0.5 tablets (25 mg total) by mouth every 4 (four) hours as needed for moderate pain. 09/27/16   Watt Climes, PA    Allergies Wellbutrin [bupropion]  and Zoloft [sertraline hcl]  Family History  Problem Relation Age of Onset  . Hypertension Father     Social History Social History   Tobacco Use  . Smoking status: Former Smoker    Packs/day: 1.00    Types: Cigarettes    Quit date: 08/13/2014    Years since quitting: 4.3  . Smokeless tobacco: Never Used  Substance Use Topics  . Alcohol use: No  . Drug use: No    Review of Systems  Constitutional: Negative for fever. Eyes: Negative for visual changes. ENT: Negative for sore throat. Neck: No neck pain  Cardiovascular: Negative for chest pain. Respiratory: Negative for shortness of breath. Gastrointestinal: Negative  for abdominal pain, vomiting or diarrhea. Genitourinary: Negative for dysuria. Musculoskeletal: Negative for back pain. Skin: Negative for rash. + snake bite Neurological: Negative for headaches, weakness or numbness. Psych: No SI or HI  ____________________________________________   PHYSICAL EXAM:  VITAL SIGNS: ED Triage Vitals  Enc Vitals Group     BP 12/05/18 1953 (!) 157/92     Pulse Rate 12/05/18 1953 68     Resp 12/05/18 1953 18     Temp 12/05/18 1953 99.1 F (37.3 C)     Temp Source 12/05/18 1953 Oral     SpO2 12/05/18 1953 100 %     Weight 12/05/18 1954 184 lb (83.5 kg)     Height 12/05/18 1954 5\' 4"  (1.626 m)     Head Circumference --      Peak Flow --      Pain Score 12/05/18 1954 3     Pain Loc --      Pain Edu? --      Excl. in [place]? --     Constitutional: Alert and oriented. Well appearing and in no apparent distress. HEENT:      Head: Normocephalic and atraumatic.         Eyes: Conjunctivae are normal. Sclera is non-icteric.       Mouth/Throat: Mucous membranes are moist.       Neck: Supple with no signs of meningismus. Cardiovascular: Regular rate and rhythm. No murmurs, gallops, or rubs. 2+ symmetrical distal pulses are present in all extremities. No JVD. Respiratory: Normal respiratory effort. Lungs are clear to auscultation bilaterally. No wheezes, crackles, or rhonchi.  Gastrointestinal: Soft, non tender, and non distended with positive bowel sounds. No rebound or guarding. Musculoskeletal: One puncture wound seeing on the inner aspect of the R heel with mild surrounding swelling of the foot, the ankle, and the lower aspect of the lower leg, there is bruising around to the puncture wound seen.  No erythema or warmth, full painless range of motion of the joints of the leg. Neurologic: Normal speech and language. Face is symmetric. Moving all extremities. No gross focal neurologic deficits are appreciated. Skin: Skin is warm, dry and intact. No rash noted.  Psychiatric: Mood and affect are normal. Speech and behavior are normal.  ____________________________________________   LABS (all labs ordered are listed, but only abnormal results are displayed)  Labs Reviewed - No data to display ____________________________________________  EKG  none  ____________________________________________  RADIOLOGY  none  ____________________________________________   PROCEDURES  Procedure(s) performed: None Procedures Critical Care performed:  None ____________________________________________   INITIAL IMPRESSION / ASSESSMENT AND PLAN / ED COURSE  68 y.o. female with history of anxiety and arthritis who presents for evaluation of a possible snake bite that happened greater than 24 hours ago.  Patient has 1 puncture wound located  in the inner aspect of the right heel with bruising around it and mild swelling of the right foot and ankle.  Extremity is warm and well-perfused with good strong pulses.  No overlying erythema or warmth.  Tetanus shot is up-to-date.  Discussed with [place] who recommended just wound care and outpatient follow-up.  No indication for labs or any further monitoring since this happened more than 24 hours ago.  Recommended elevation, discussed wound care and pain control with extra strength Tylenol and ibuprofen.  Discussed close follow-up with primary care doctor my standard return precautions.       As part of my medical decision making, I reviewed the following data within the [facility] notes reviewed and incorporated, Old chart reviewed, A consult was requested and obtained from this/these consultant(s) Poison Control, Notes from prior ED visits and [place] Controlled Substance Database   Patient was evaluated in Emergency Department today for the symptoms described in the history of present illness. Patient was evaluated in the context of the global COVID-19 pandemic, which necessitated  consideration that the patient might be at risk for infection with the SARS-CoV-2 virus that causes COVID-19. Institutional protocols and algorithms that pertain to the evaluation of patients at risk for COVID-19 are in a state of rapid change based on information released by regulatory bodies including the CDC and federal and state organizations. These policies and algorithms were followed during the patient's care in the ED.   ____________________________________________   FINAL CLINICAL IMPRESSION(S) / ED DIAGNOSES   Final diagnoses:  Snake bite, initial encounter      NEW MEDICATIONS STARTED DURING THIS VISIT:  ED Discharge Orders    None       Note:  This document was prepared using Dragon voice recognition software and may include unintentional dictation errors.    Alfred Levins, [place], MD 12/05/18 [phone]

## 2018-12-06 NOTE — ED Notes (Signed)
L foot = 8.6" R foot = 9.1" L ankle = 8.85" R ankle = 9.9" L calf = 14.75 R calf = 15.125" L thigh = 20.625" R thigh = 20.625"

## 2018-12-13 ENCOUNTER — Ambulatory Visit (INDEPENDENT_AMBULATORY_CARE_PROVIDER_SITE_OTHER): Payer: Medicare HMO | Admitting: Internal Medicine

## 2018-12-13 ENCOUNTER — Other Ambulatory Visit: Payer: Self-pay

## 2018-12-13 ENCOUNTER — Encounter: Payer: Self-pay | Admitting: Internal Medicine

## 2018-12-13 DIAGNOSIS — E785 Hyperlipidemia, unspecified: Secondary | ICD-10-CM | POA: Diagnosis not present

## 2018-12-13 DIAGNOSIS — E2839 Other primary ovarian failure: Secondary | ICD-10-CM | POA: Diagnosis not present

## 2018-12-13 DIAGNOSIS — M8580 Other specified disorders of bone density and structure, unspecified site: Secondary | ICD-10-CM

## 2018-12-13 DIAGNOSIS — F419 Anxiety disorder, unspecified: Secondary | ICD-10-CM

## 2018-12-13 DIAGNOSIS — Z1231 Encounter for screening mammogram for malignant neoplasm of breast: Secondary | ICD-10-CM

## 2018-12-13 DIAGNOSIS — R69 Illness, unspecified: Secondary | ICD-10-CM | POA: Diagnosis not present

## 2018-12-13 DIAGNOSIS — Z13220 Encounter for screening for lipoid disorders: Secondary | ICD-10-CM

## 2018-12-13 DIAGNOSIS — E559 Vitamin D deficiency, unspecified: Secondary | ICD-10-CM | POA: Diagnosis not present

## 2018-12-13 DIAGNOSIS — I10 Essential (primary) hypertension: Secondary | ICD-10-CM

## 2018-12-13 DIAGNOSIS — Z1329 Encounter for screening for other suspected endocrine disorder: Secondary | ICD-10-CM

## 2018-12-13 DIAGNOSIS — M1990 Unspecified osteoarthritis, unspecified site: Secondary | ICD-10-CM | POA: Insufficient documentation

## 2018-12-13 DIAGNOSIS — Z0000 Encounter for general adult medical examination without abnormal findings: Secondary | ICD-10-CM

## 2018-12-13 DIAGNOSIS — Z1389 Encounter for screening for other disorder: Secondary | ICD-10-CM

## 2018-12-13 DIAGNOSIS — J309 Allergic rhinitis, unspecified: Secondary | ICD-10-CM | POA: Insufficient documentation

## 2018-12-13 NOTE — Patient Instructions (Addendum)
Amlodipine tablets What is this medicine? AMLODIPINE (am LOE di peen) is a calcium-channel blocker. It affects the amount of calcium found in your heart and muscle cells. This relaxes your blood vessels, which can reduce the amount of work the heart has to do. This medicine is used to lower high blood pressure. It is also used to prevent chest pain. This medicine may be used for other purposes; ask your health care provider or pharmacist if you have questions. COMMON BRAND NAME(S): Norvasc What should I tell my health care provider before I take this medicine? They need to know if you have any of these conditions:  heart disease  liver disease  an unusual or allergic reaction to amlodipine, other medicines, foods, dyes, or preservatives  pregnant or trying to get pregnant  breast-feeding How should I use this medicine? Take this medicine by mouth with a glass of water. Follow the directions on the prescription label. You can take it with or without food. If it upsets your stomach, take it with food. Take your medicine at regular intervals. Do not take it more often than directed. Do not stop taking except on your doctor's advice. Talk to your pediatrician regarding the use of this medicine in children. While this drug may be prescribed for children as young as 6 years for selected conditions, precautions do apply. Patients over 25 years of age may have a stronger reaction and need a smaller dose. Overdosage: If you think you have taken too much of this medicine contact a poison control center or emergency room at once. NOTE: This medicine is only for you. Do not share this medicine with others. What if I miss a dose? If you miss a dose, take it as soon as you can. If it is almost time for your next dose, take only that dose. Do not take double or extra doses. What may interact with this medicine? Do not take this medicine with any of the following medications:  tranylcypromine This  medicine may also interact with the following medications:  clarithromycin  cyclosporine  diltiazem  itraconazole  simvastatin  tacrolimus This list may not describe all possible interactions. Give your health care provider a list of all the medicines, herbs, non-prescription drugs, or dietary supplements you use. Also tell them if you smoke, drink alcohol, or use illegal drugs. Some items may interact with your medicine. What should I watch for while using this medicine? Visit your healthcare professional for regular checks on your progress. Check your blood pressure as directed. Ask your healthcare professional what your blood pressure should be and when you should contact him or her. Do not treat yourself for coughs, colds, or pain while you are using this medicine without asking your healthcare professional for advice. Some medicines may increase your blood pressure. You may get dizzy. Do not drive, use machinery, or do anything that needs mental alertness until you know how this medicine affects you. Do not stand or sit up quickly, especially if you are an older patient. This reduces the risk of dizzy or fainting spells. Avoid alcoholic drinks; they can make you dizzier. What side effects may I notice from receiving this medicine? Side effects that you should report to your doctor or health care professional as soon as possible:  allergic reactions like skin rash, itching or hives; swelling of the face, lips, or tongue  fast, irregular heartbeat  signs and symptoms of low blood pressure like dizziness; feeling faint or lightheaded, falls; unusually weak  or tired  swelling of ankles, feet, hands Side effects that usually do not require medical attention (report these to your doctor or health care professional if they continue or are bothersome):  dry mouth  facial flushing  headache  stomach pain  tiredness This list may not describe all possible side effects. Call your  doctor for medical advice about side effects. You may report side effects to FDA at 1-800-FDA-1088. Where should I keep my medicine? Keep out of the reach of children. Store at room temperature between 59 and 86 degrees F (15 and 30 degrees C). Throw away any unused medicine after the expiration date. NOTE: This sheet is a summary. It may not cover all possible information. If you have questions about this medicine, talk to your doctor, pharmacist, or health care provider.  2020 Elsevier/Gold Standard (2017-11-23 15:07:10)  Hypertension, Adult High blood pressure (hypertension) is when the force of blood pumping through the arteries is too strong. The arteries are the blood vessels that carry blood from the heart throughout the body. Hypertension forces the heart to work harder to pump blood and may cause arteries to become narrow or stiff. Untreated or uncontrolled hypertension can cause a heart attack, heart failure, a stroke, kidney disease, and other problems. A blood pressure reading consists of a higher number over a lower number. Ideally, your blood pressure should be below 120/80. The first ("top") number is called the systolic pressure. It is a measure of the pressure in your arteries as your heart beats. The second ("bottom") number is called the diastolic pressure. It is a measure of the pressure in your arteries as the heart relaxes. What are the causes? The exact cause of this condition is not known. There are some conditions that result in or are related to high blood pressure. What increases the risk? Some risk factors for high blood pressure are under your control. The following factors may make you more likely to develop this condition:  Smoking.  Having type 2 diabetes mellitus, high cholesterol, or both.  Not getting enough exercise or physical activity.  Being overweight.  Having too much fat, sugar, calories, or salt (sodium) in your diet.  Drinking too much  alcohol. Some risk factors for high blood pressure may be difficult or impossible to change. Some of these factors include:  Having chronic kidney disease.  Having a family history of high blood pressure.  Age. Risk increases with age.  Race. You may be at higher risk if you are African American.  Gender. Men are at higher risk than women before age 65. After age 15, women are at higher risk than men.  Having obstructive sleep apnea.  Stress. What are the signs or symptoms? High blood pressure may not cause symptoms. Very high blood pressure (hypertensive crisis) may cause:  Headache.  Anxiety.  Shortness of breath.  Nosebleed.  Nausea and vomiting.  Vision changes.  Severe chest pain.  Seizures. How is this diagnosed? This condition is diagnosed by measuring your blood pressure while you are seated, with your arm resting on a flat surface, your legs uncrossed, and your feet flat on the floor. The cuff of the blood pressure monitor will be placed directly against the skin of your upper arm at the level of your heart. It should be measured at least twice using the same arm. Certain conditions can cause a difference in blood pressure between your right and left arms. Certain factors can cause blood pressure readings to be lower  or higher than normal for a short period of time:  When your blood pressure is higher when you are in a health care provider's office than when you are at home, this is called white coat hypertension. Most people with this condition do not need medicines.  When your blood pressure is higher at home than when you are in a health care provider's office, this is called masked hypertension. Most people with this condition may need medicines to control blood pressure. If you have a high blood pressure reading during one visit or you have normal blood pressure with other risk factors, you may be asked to:  Return on a different day to have your blood  pressure checked again.  Monitor your blood pressure at home for 1 week or longer. If you are diagnosed with hypertension, you may have other blood or imaging tests to help your health care provider understand your overall risk for other conditions. How is this treated? This condition is treated by making healthy lifestyle changes, such as eating healthy foods, exercising more, and reducing your alcohol intake. Your health care provider may prescribe medicine if lifestyle changes are not enough to get your blood pressure under control, and if:  Your systolic blood pressure is above 130.  Your diastolic blood pressure is above 80. Your personal target blood pressure may vary depending on your medical conditions, your age, and other factors. Follow these instructions at home: Eating and drinking   Eat a diet that is high in fiber and potassium, and low in sodium, added sugar, and fat. An example eating plan is called the DASH (Dietary Approaches to Stop Hypertension) diet. To eat this way: ? Eat plenty of fresh fruits and vegetables. Try to fill one half of your plate at each meal with fruits and vegetables. ? Eat whole grains, such as whole-wheat pasta, brown rice, or whole-grain bread. Fill about one fourth of your plate with whole grains. ? Eat or drink low-fat dairy products, such as skim milk or low-fat yogurt. ? Avoid fatty cuts of meat, processed or cured meats, and poultry with skin. Fill about one fourth of your plate with lean proteins, such as fish, chicken without skin, beans, eggs, or tofu. ? Avoid pre-made and processed foods. These tend to be higher in sodium, added sugar, and fat.  Reduce your daily sodium intake. Most people with hypertension should eat less than 1,500 mg of sodium a day.  Do not drink alcohol if: ? Your health care provider tells you not to drink. ? You are pregnant, may be pregnant, or are planning to become pregnant.  If you drink alcohol: ? Limit how  much you use to:  0-1 drink a day for women.  0-2 drinks a day for men. ? Be aware of how much alcohol is in your drink. In the [place] one drink equals one 12 oz bottle of beer (355 mL), one 5 oz glass of wine (148 mL), or one 1 oz glass of hard liquor (44 mL). Lifestyle   Work with your health care provider to maintain a healthy body weight or to lose weight. Ask what an ideal weight is for you.  Get at least 30 minutes of exercise most days of the week. Activities may include walking, swimming, or biking.  Include exercise to strengthen your muscles (resistance exercise), such as Pilates or lifting weights, as part of your weekly exercise routine. Try to do these types of exercises for 30 minutes at least 3  days a week.  Do not use any products that contain nicotine or tobacco, such as cigarettes, e-cigarettes, and chewing tobacco. If you need help quitting, ask your health care provider.  Monitor your blood pressure at home as told by your health care provider.  Keep all follow-up visits as told by your health care provider. This is important. Medicines  Take over-the-counter and prescription medicines only as told by your health care provider. Follow directions carefully. Blood pressure medicines must be taken as prescribed.  Do not skip doses of blood pressure medicine. Doing this puts you at risk for problems and can make the medicine less effective.  Ask your health care provider about side effects or reactions to medicines that you should watch for. Contact a health care provider if you:  Think you are having a reaction to a medicine you are taking.  Have headaches that keep coming back (recurring).  Feel dizzy.  Have swelling in your ankles.  Have trouble with your vision. Get help right away if you:  Develop a severe headache or confusion.  Have unusual weakness or numbness.  Feel faint.  Have severe pain in your chest or abdomen.  Vomit repeatedly.  Have  trouble breathing. Summary  Hypertension is when the force of blood pumping through your arteries is too strong. If this condition is not controlled, it may put you at risk for serious complications.  Your personal target blood pressure may vary depending on your medical conditions, your age, and other factors. For most people, a normal blood pressure is less than 120/80.  Hypertension is treated with lifestyle changes, medicines, or a combination of both. Lifestyle changes include losing weight, eating a healthy, low-sodium diet, exercising more, and limiting alcohol. This information is not intended to replace advice given to you by your health care provider. Make sure you discuss any questions you have with your health care provider. Document Released: 05/01/2005 Document Revised: 01/09/2018 Document Reviewed: 01/09/2018 Elsevier Patient Education  [place].  High Cholesterol  High cholesterol is a condition in which the blood has high levels of a white, waxy, fat-like substance (cholesterol). The human body needs small amounts of cholesterol. The liver makes all the cholesterol that the body needs. Extra (excess) cholesterol comes from the food that we eat. Cholesterol is carried from the liver by the blood through the blood vessels. If you have high cholesterol, deposits (plaques) may build up on the walls of your blood vessels (arteries). Plaques make the arteries narrower and stiffer. Cholesterol plaques increase your risk for heart attack and stroke. Work with your health care provider to keep your cholesterol levels in a healthy range. What increases the risk? This condition is more likely to develop in people who:  Eat foods that are high in animal fat (saturated fat) or cholesterol.  Are overweight.  Are not getting enough exercise.  Have a family history of high cholesterol. What are the signs or symptoms? There are no symptoms of this condition. How is this  diagnosed? This condition may be diagnosed from the results of a blood test.  If you are older than age 32, your health care provider may check your cholesterol every 4-6 years.  You may be checked more often if you already have high cholesterol or other risk factors for heart disease. The blood test for cholesterol measures:  "Bad" cholesterol (LDL cholesterol). This is the main type of cholesterol that causes heart disease. The desired level for LDL is less than  100.  "Good" cholesterol (HDL cholesterol). This type helps to protect against heart disease by cleaning the arteries and carrying the LDL away. The desired level for HDL is 60 or higher.  Triglycerides. These are fats that the body can store or burn for energy. The desired number for triglycerides is lower than 150.  Total cholesterol. This is a measure of the total amount of cholesterol in your blood, including LDL cholesterol, HDL cholesterol, and triglycerides. A healthy number is less than 200. How is this treated? This condition is treated with diet changes, lifestyle changes, and medicines. Diet changes  This may include eating more whole grains, fruits, vegetables, nuts, and fish.  This may also include cutting back on red meat and foods that have a lot of added sugar. Lifestyle changes  Changes may include getting at least 40 minutes of aerobic exercise 3 times a week. Aerobic exercises include walking, biking, and swimming. Aerobic exercise along with a healthy diet can help you maintain a healthy weight.  Changes may also include quitting smoking. Medicines  Medicines are usually given if diet and lifestyle changes have failed to reduce your cholesterol to healthy levels.  Your health care provider may prescribe a statin medicine. Statin medicines have been shown to reduce cholesterol, which can reduce the risk of heart disease. Follow these instructions at home: Eating and drinking If told by your health care  provider:  Eat chicken (without skin), fish, veal, shellfish, ground [place] breast, and round or loin cuts of red meat.  Do not eat fried foods or fatty meats, such as hot dogs and salami.  Eat plenty of fruits, such as apples.  Eat plenty of vegetables, such as broccoli, potatoes, and carrots.  Eat beans, peas, and lentils.  Eat grains such as barley, rice, couscous, and bulgur wheat.  Eat pasta without cream sauces.  Use skim or nonfat milk, and eat low-fat or nonfat yogurt and cheeses.  Do not eat or drink whole milk, cream, ice cream, egg yolks, or hard cheeses.  Do not eat stick margarine or tub margarines that contain trans fats (also called partially hydrogenated oils).  Do not eat saturated tropical oils, such as coconut oil and palm oil.  Do not eat cakes, cookies, crackers, or other baked goods that contain trans fats.  General instructions  Exercise as directed by your health care provider. Increase your activity level with activities such as gardening, walking, and taking the stairs.  Take over-the-counter and prescription medicines only as told by your health care provider.  Do not use any products that contain nicotine or tobacco, such as cigarettes and e-cigarettes. If you need help quitting, ask your health care provider.  Keep all follow-up visits as told by your health care provider. This is important. Contact a health care provider if:  You are struggling to maintain a healthy diet or weight.  You need help to start on an exercise program.  You need help to stop smoking. Get help right away if:  You have chest pain.  You have trouble breathing. This information is not intended to replace advice given to you by your health care provider. Make sure you discuss any questions you have with your health care provider. Document Released: 05/01/2005 Document Revised: 05/04/2017 Document Reviewed: 10/30/2015 Elsevier Patient Education  2020 [employer].

## 2018-12-13 NOTE — Progress Notes (Addendum)
Virtual Visit via Video Note  I connected with Shannon [name]   on 12/13/18 at 11:00 AM EDT by a video enabled telemedicine application and verified that I am speaking with the correct person using two identifiers.  Location patient: home Location provider:work or home office Persons participating in the virtual visit: patient, provider  I discussed the limitations of evaluation and management by telemedicine and the availability of in person appointments. The patient expressed understanding and agreed to proceed.   HPI: 1. C/o anxiety and stress due to a lot of life changes she used to take Flomaton but had h/a with this and paxil increased sweating and side effect from zoloft and wellbutrin in the past she really does not want to take meds  2. 12/05/18 ED visit for suspected right heel copperhead bite area is not warm redness is going away about the size of a quarter and hurts a little bit and itching with some bruising up to her calf no labs were done 3. H/o HLD  ROS: See pertinent positives and negatives per HPI. General: wt stable  HEENT: no sore throat  CV: no chest pain  Lungs: no sob  GI: no ab pain  MSK: chronic hip pain and some back pain  Neuro: no h/a  Psych: +anxiety/stress Skin: no issues  Gu: no issues    Past Medical History:  Diagnosis Date  . Allergic rhinitis   . Anxiety   . Arthritis    Osteoarthritis mild DDD lumbar spine noted in 2018   . Headache   . Stress   . Trochanteric bursitis     Past Surgical History:  Procedure Laterality Date  . CATARACT EXTRACTION     b/l 10 and 04/2018  . CERVICAL CERCLAGE      X 3  . JOINT REPLACEMENT    . TOTAL HIP ARTHROPLASTY Right 08/24/2014   Dr. Marry Guan, [facility]  . TOTAL HIP ARTHROPLASTY Left 09/25/2016   Procedure: TOTAL HIP ARTHROPLASTY;  Surgeon: Dereck Leep, MD;  Location: [facility] ORS;  Service: Orthopedics;  Laterality: Left;    Family History  Problem Relation Age of Onset  . Osteoporosis Mother    . Arthritis/Rheumatoid Mother   . Hypertension Father   . Lung cancer Father        deid 66  . Other Father        brain tumor  . Other Sister        brain tumor glioblastoma   . Gout Son   . Other Son        cognitive d/o    SOCIAL HX:  Lives with long term boyfriend 66 years older than her 1 son special needs lives in own place 68 y.o  3 kids total all sons  Anica Alcaraz son DPR   Current Outpatient Medications:  .  acetaminophen (TYLENOL) 500 MG tablet, Take 500 mg by mouth every 6 (six) hours as needed for mild pain., Disp: , Rfl:  .  Artificial Tear Solution (SOOTHE XP) SOLN, Apply 1-2 drops to eye as needed (dry eye)., Disp: , Rfl:  .  Cholecalciferol (VITAMIN D3) 2000 units capsule, Take 2,000 Units by mouth daily., Disp: , Rfl:  .  fexofenadine (ALLEGRA) 180 MG tablet, Take 180 mg by mouth daily as needed for allergies or rhinitis., Disp: , Rfl:  .  FIBER ADULT GUMMIES PO, Take 1 tablet by mouth daily., Disp: , Rfl:  .  Glucosamine-Chondroitin (GLUCOSAMINE CHONDR COMPLEX PO), Take 1 tablet by  mouth daily., Disp: , Rfl:  .  Ketotifen Fumarate (EYE ITCH RELIEF OP), Place 1 drop into both eyes daily., Disp: , Rfl:  .  Melatonin 3 MG TABS, Take 1.5 mg by mouth at bedtime as needed (sleep)., Disp: , Rfl:  .  pyridOXINE (VITAMIN B-6) 100 MG tablet, Take 50 mg by mouth 3 (three) times a week., Disp: , Rfl:   EXAM:  VITALS per patient if applicable:  GENERAL: alert, oriented, appears well and in no acute distress  HEENT: atraumatic, conjunttiva clear, no obvious abnormalities on inspection of external nose and ears  NECK: normal movements of the head and neck  LUNGS: on inspection no signs of respiratory distress, breathing rate appears normal, no obvious gross SOB, gasping or wheezing  CV: no obvious cyanosis  MS: moves all visible extremities without noticeable abnormality  PSYCH/NEURO: pleasant and cooperative, no obvious depression or anxiety, speech and thought  processing grossly intact  ASSESSMENT AND PLAN:  Discussed the following assessment and plan:  Hyperlipidemia, unspecified hyperlipidemia type - Plan: Lipid panel She thinks she was on simvastatin in the past but made her forgetful so she stopped   Anxiety - Plan: disc L theanine supplement otc   Estrogen deficiency with h/o osteopenia - Plan: DG Bone Density  htn  Add norvasc 2.5 mg qd f/u in 1-2 weeks end 12/2018 01/2019   HM sch fasting labs asap and BP check BP elevated in the ED sbp 150s no h/o HTN   Flu shot disc at f/u with prevnar and shingrix  utd Tdap and pna 23   Referred mammogram and  DEXA in 2015 DEXA osteopenia Colonoscopy 11/20/13 hyperplastic polyps and anal lesion pt considering removal will need referral in future if interested  Pap 10/17/13 negative  hcv neg 09/03/11  Former smoker quit in 2016 smoked x 20 years max 1-2 ppd FH father lung cancer   Ortho Dr. Marry Guan     I discussed the assessment and treatment plan with the patient. The patient was provided an opportunity to ask questions and all were answered. The patient agreed with the plan and demonstrated an understanding of the instructions.   The patient was advised to call back or seek an in-person evaluation if the symptoms worsen or if the condition fails to improve as anticipated.  Time spent 30 minutes   Delorise Jackson, MD

## 2018-12-19 ENCOUNTER — Encounter: Payer: Self-pay | Admitting: Internal Medicine

## 2018-12-26 ENCOUNTER — Ambulatory Visit: Payer: Self-pay

## 2018-12-26 DIAGNOSIS — M5442 Lumbago with sciatica, left side: Secondary | ICD-10-CM | POA: Diagnosis not present

## 2018-12-26 NOTE — Telephone Encounter (Signed)
Called and left VM to return call to office. Pt having back pain since Saturday.

## 2018-12-26 NOTE — Telephone Encounter (Signed)
Recommendation given to pt to go ED or UC.  Patient said that her cat has a Animal nutritionist appt today at 3:30 pm today.  Pt plans on going to UC later today after that appt.

## 2018-12-26 NOTE — Telephone Encounter (Signed)
rec go to ED or urgent care for this may need MRI   [place]

## 2018-12-26 NOTE — Telephone Encounter (Signed)
Pt returned call.  She states that Saturday she reached or bent for something and heard a popping sound and since then she has has pain to her left lower back that radiates through her hip and back of left leg.  She states that the pain is bad at 8 when trying to walk or sit on something hard. She states that the left leg will get weak.  She denies numbness. She denies bowel and bladder control issues. Care advice read to patient she verbalized understanding of all instructions. Call placed to office but no appointment is available. Per Butch Penny patient should contact her orthopedic doctor or be seen at [facility] or [facility] today. Pt verbalized understanding. Reason for Disposition . Pain radiates into the thigh or further down the leg now  Answer Assessment - Initial Assessment Questions 1. MECHANISM: "How did the injury happen?" (Consider the possibility of domestic violence or elder abuse)     reaching 2. ONSET: "When did the injury happen?" (Minutes or hours ago)    Saturday 3. LOCATION: "What part of the back is injured?"     Low left toward the hip 4. SEVERITY: "Can you move the back normally?"     no 5. PAIN: "Is there any pain?" If so, ask: "How bad is the pain?"   (Scale 1-10; or mild, moderate, severe)     8 6. CORD SYMPTOMS: Any weakness or numbness of the arms or legs?"     Left leg weak pain 7. SIZE: For cuts, bruises, or swelling, ask: "How large is it?" (e.g., inches or centimeters)     No 8. TETANUS: For any breaks in the skin, ask: "When was the last tetanus booster?"    N/A 9. OTHER SYMPTOMS: "Do you have any other symptoms?" (e.g., abdominal pain, blood in urine)     no 10. PREGNANCY: "Is there any chance you are pregnant?" "When was your last menstrual period?"      N/A  Protocols used: BACK INJURY-A-AH

## 2019-01-02 ENCOUNTER — Other Ambulatory Visit: Payer: Self-pay | Admitting: Internal Medicine

## 2019-01-02 ENCOUNTER — Ambulatory Visit (INDEPENDENT_AMBULATORY_CARE_PROVIDER_SITE_OTHER): Payer: Medicare HMO

## 2019-01-02 ENCOUNTER — Other Ambulatory Visit (INDEPENDENT_AMBULATORY_CARE_PROVIDER_SITE_OTHER): Payer: Medicare HMO

## 2019-01-02 ENCOUNTER — Other Ambulatory Visit: Payer: Self-pay

## 2019-01-02 ENCOUNTER — Ambulatory Visit
Admission: RE | Admit: 2019-01-02 | Discharge: 2019-01-02 | Disposition: A | Discharge status: Home / Self Care | Payer: Medicare HMO | Source: Ambulatory Visit | Attending: Internal Medicine | Admitting: Internal Medicine

## 2019-01-02 VITALS — BP 150/70 | HR 67

## 2019-01-02 DIAGNOSIS — E785 Hyperlipidemia, unspecified: Secondary | ICD-10-CM

## 2019-01-02 DIAGNOSIS — I10 Essential (primary) hypertension: Secondary | ICD-10-CM | POA: Diagnosis not present

## 2019-01-02 DIAGNOSIS — Z1389 Encounter for screening for other disorder: Secondary | ICD-10-CM

## 2019-01-02 DIAGNOSIS — Z0000 Encounter for general adult medical examination without abnormal findings: Secondary | ICD-10-CM | POA: Diagnosis not present

## 2019-01-02 DIAGNOSIS — M5440 Lumbago with sciatica, unspecified side: Secondary | ICD-10-CM | POA: Diagnosis not present

## 2019-01-02 DIAGNOSIS — Z1329 Encounter for screening for other suspected endocrine disorder: Secondary | ICD-10-CM

## 2019-01-02 DIAGNOSIS — Z13220 Encounter for screening for lipoid disorders: Secondary | ICD-10-CM

## 2019-01-02 DIAGNOSIS — E559 Vitamin D deficiency, unspecified: Secondary | ICD-10-CM | POA: Diagnosis not present

## 2019-01-02 DIAGNOSIS — M5136 Other intervertebral disc degeneration, lumbar region: Secondary | ICD-10-CM | POA: Diagnosis not present

## 2019-01-02 LAB — LIPID PANEL
Cholesterol: 242 mg/dL — ABNORMAL HIGH (ref 0–200)
HDL: 57.9 mg/dL (ref >39.00)
LDL Cholesterol: 158 mg/dL — ABNORMAL HIGH (ref 0–99)
NonHDL: 183.67
Total CHOL/HDL Ratio: 4
Triglycerides: 126 mg/dL (ref 0.0–149.0)
VLDL: 25.2 mg/dL (ref 0.0–40.0)

## 2019-01-02 LAB — COMPREHENSIVE METABOLIC PANEL
ALT: 15 U/L (ref 0–35)
AST: 19 U/L (ref 0–37)
Albumin: 4.5 g/dL (ref 3.5–5.2)
Alkaline Phosphatase: 89 U/L (ref 39–117)
BUN: 16 mg/dL (ref 6–23)
CO2: 28 mEq/L (ref 19–32)
Calcium: 9.9 mg/dL (ref 8.4–10.5)
Chloride: 105 mEq/L (ref 96–112)
Creatinine, Ser: 0.86 mg/dL (ref 0.40–1.20)
GFR: 65.92 mL/min (ref >60.00)
Glucose, Bld: 92 mg/dL (ref 70–99)
Potassium: 3.9 mEq/L (ref 3.5–5.1)
Sodium: 142 mEq/L (ref 135–145)
Total Bilirubin: 0.5 mg/dL (ref 0.2–1.2)
Total Protein: 6.8 g/dL (ref 6.0–8.3)

## 2019-01-02 LAB — CBC WITH DIFFERENTIAL/PLATELET
Basophils Absolute: 0.1 10*3/uL (ref 0.0–0.1)
Basophils Relative: 1.8 % (ref 0.0–3.0)
Eosinophils Absolute: 0.3 10*3/uL (ref 0.0–0.7)
Eosinophils Relative: 4.2 % (ref 0.0–5.0)
HCT: 39.5 % (ref 36.0–46.0)
Hemoglobin: 13.3 g/dL (ref 12.0–15.0)
Lymphocytes Relative: 31.5 % (ref 12.0–46.0)
Lymphs Abs: 2.5 10*3/uL (ref 0.7–4.0)
MCHC: 33.7 g/dL (ref 30.0–36.0)
MCV: 85 fl (ref 78.0–100.0)
Monocytes Absolute: 0.4 10*3/uL (ref 0.1–1.0)
Monocytes Relative: 5.3 % (ref 3.0–12.0)
Neutro Abs: 4.5 10*3/uL (ref 1.4–7.7)
Neutrophils Relative %: 57.2 % (ref 43.0–77.0)
Platelets: 331 10*3/uL (ref 150.0–400.0)
RBC: 4.64 Mil/uL (ref 3.87–5.11)
RDW: 14.5 % (ref 11.5–15.5)
WBC: 7.9 10*3/uL (ref 4.0–10.5)

## 2019-01-02 LAB — VITAMIN D 25 HYDROXY (VIT D DEFICIENCY, FRACTURES): VITD: 39.99 ng/mL (ref 30.00–100.00)

## 2019-01-02 LAB — TSH: TSH: 0.74 u[IU]/mL (ref 0.35–4.50)

## 2019-01-02 MED ORDER — AMLODIPINE BESYLATE 2.5 MG PO TABS: 2.5000 mg | ORAL_TABLET | Freq: Every day | ORAL | 2 refills | Status: DC

## 2019-01-02 NOTE — Addendum Note (Signed)
Addended by: Orland Mustard on: 01/02/2019 10:07 AM   Modules accepted: Orders

## 2019-01-02 NOTE — Progress Notes (Signed)
Patient presented today for nurse visit BP check per MD order (See OV note on 12/13/18).  Patient reports that she is not on any bp meds.  Patient's bp was checked at 9:40 am this morning in office reading was 150/70.  Allowed patient to rest 10 more minutes.  BP taken at 9:50 am and reading was 142/88.  Pt denies having any cardiac symptoms today.  Patient c/o of hip pain while in office.  Pt said that she went to a walk-in clinic a few days ago after hearing a crack in her back. Pt said she was told it was possibly sciatica pain.  Patient said she was given naproxen and hydrocodone for pain.  Patient said that she began taking the napoxen but it causes her stomach to hurt. Pt said that she has not started taking the hydrocodone.  Readings and information given to Dr. Aundra Dubin for review while patient was in office.  BP Readings from Last 3 Encounters:  01/02/19 (!) 150/70  12/06/18 (!) 154/91  09/27/16 (!) 118/46   Pulse Readings from Last 3 Encounters:  01/02/19 67  12/06/18 (!) 59  09/27/16 95    Per Dr. Aundra Dubin patient is to start taking 2.5 mg of amlodipine daily.  Dr. Aundra Dubin has put in an order for back Xray.  Pt should go to [facility] imaging today (information w/ clinic location and phone number given to pt).  Pt should also f/u with ortho Dr. Marry Guan.  Dr. Aundra Dubin wants pt to scheduled an in-person f/u appt with her next week if possible or 1 to 2 weeks.    Patient verbalized understanding of instructions.   Patient said that she "hates taking medicine" and wants to hold off on taking the amlodipine until her lab results come back.    Dorise Bullion, CMA

## 2019-01-03 ENCOUNTER — Other Ambulatory Visit: Payer: Self-pay | Admitting: Internal Medicine

## 2019-01-03 DIAGNOSIS — M5441 Lumbago with sciatica, right side: Secondary | ICD-10-CM

## 2019-01-03 DIAGNOSIS — M5442 Lumbago with sciatica, left side: Secondary | ICD-10-CM

## 2019-01-03 LAB — URINALYSIS, ROUTINE W REFLEX MICROSCOPIC
Bilirubin Urine: NEGATIVE
Glucose, UA: NEGATIVE
Hgb urine dipstick: NEGATIVE
Ketones, ur: NEGATIVE
Leukocytes,Ua: NEGATIVE
Nitrite: NEGATIVE
Protein, ur: NEGATIVE
Specific Gravity, Urine: 1.009 (ref 1.001–1.03)
pH: 5.5 (ref 5.0–8.0)

## 2019-01-03 NOTE — Progress Notes (Signed)
Wanted to start norvasc 2.5 mg qd but pt did not   [place]

## 2019-01-07 ENCOUNTER — Ambulatory Visit (INDEPENDENT_AMBULATORY_CARE_PROVIDER_SITE_OTHER): Payer: Medicare HMO | Admitting: Internal Medicine

## 2019-01-07 ENCOUNTER — Other Ambulatory Visit: Payer: Self-pay

## 2019-01-07 ENCOUNTER — Encounter: Payer: Self-pay | Admitting: Internal Medicine

## 2019-01-07 VITALS — BP 124/72 | HR 76 | Temp 98.3°F | Resp 16 | Wt 180.8 lb

## 2019-01-07 DIAGNOSIS — M419 Scoliosis, unspecified: Secondary | ICD-10-CM | POA: Diagnosis not present

## 2019-01-07 DIAGNOSIS — L309 Dermatitis, unspecified: Secondary | ICD-10-CM | POA: Diagnosis not present

## 2019-01-07 DIAGNOSIS — E785 Hyperlipidemia, unspecified: Secondary | ICD-10-CM | POA: Diagnosis not present

## 2019-01-07 DIAGNOSIS — R030 Elevated blood-pressure reading, without diagnosis of hypertension: Secondary | ICD-10-CM | POA: Diagnosis not present

## 2019-01-07 DIAGNOSIS — M5442 Lumbago with sciatica, left side: Secondary | ICD-10-CM | POA: Diagnosis not present

## 2019-01-07 DIAGNOSIS — Z23 Encounter for immunization: Secondary | ICD-10-CM | POA: Diagnosis not present

## 2019-01-07 MED ORDER — TIZANIDINE HCL 4 MG PO CAPS: 2.0000 mg | ORAL_CAPSULE | Freq: Every evening | ORAL | 2 refills | Status: DC | PRN

## 2019-01-07 NOTE — Patient Instructions (Signed)
Back Exercises The following exercises strengthen the muscles that help to support the trunk and back. They also help to keep the lower back flexible. Doing these exercises can help to prevent back pain or lessen existing pain.  If you have back pain or discomfort, try doing these exercises 2-3 times each day or as told by your health care provider.  As your pain improves, do them once each day, but increase the number of times that you repeat the steps for each exercise (do more repetitions).  To prevent the recurrence of back pain, continue to do these exercises once each day or as told by your health care provider. Do exercises exactly as told by your health care provider and adjust them as directed. It is normal to feel mild stretching, pulling, tightness, or discomfort as you do these exercises, but you should stop right away if you feel sudden pain or your pain gets worse. Exercises Single knee to chest Repeat these steps 3-5 times for each leg: 1. Lie on your back on a firm bed or the floor with your legs extended. 2. Bring one knee to your chest. Your other leg should stay extended and in contact with the floor. 3. Hold your knee in place by grabbing your knee or thigh with both hands and hold. 4. Pull on your knee until you feel a gentle stretch in your lower back or buttocks. 5. Hold the stretch for 10-30 seconds. 6. Slowly release and straighten your leg. Pelvic tilt Repeat these steps 5-10 times: 1. Lie on your back on a firm bed or the floor with your legs extended. 2. Bend your knees so they are pointing toward the ceiling and your feet are flat on the floor. 3. Tighten your lower abdominal muscles to press your lower back against the floor. This motion will tilt your pelvis so your tailbone points up toward the ceiling instead of pointing to your feet or the floor. 4. With gentle tension and even breathing, hold this position for 5-10 seconds. Cat-cow Repeat these steps until  your lower back becomes more flexible: 1. Get into a hands-and-knees position on a firm surface. Keep your hands under your shoulders, and keep your knees under your hips. You may place padding under your knees for comfort. 2. Let your head hang down toward your chest. Contract your abdominal muscles and point your tailbone toward the floor so your lower back becomes rounded like the back of a cat. 3. Hold this position for 5 seconds. 4. Slowly lift your head, let your abdominal muscles relax and point your tailbone up toward the ceiling so your back forms a sagging arch like the back of a cow. 5. Hold this position for 5 seconds.  Press-ups Repeat these steps 5-10 times: 1. Lie on your abdomen (face-down) on the floor. 2. Place your palms near your head, about shoulder-width apart. 3. Keeping your back as relaxed as possible and keeping your hips on the floor, slowly straighten your arms to raise the top half of your body and lift your shoulders. Do not use your back muscles to raise your upper torso. You may adjust the placement of your hands to make yourself more comfortable. 4. Hold this position for 5 seconds while you keep your back relaxed. 5. Slowly return to lying flat on the floor.  Bridges Repeat these steps 10 times: 1. Lie on your back on a firm surface. 2. Bend your knees so they are pointing toward the ceiling and   your feet are flat on the floor. Your arms should be flat at your sides, next to your body. 3. Tighten your buttocks muscles and lift your buttocks off the floor until your waist is at almost the same height as your knees. You should feel the muscles working in your buttocks and the back of your thighs. If you do not feel these muscles, slide your feet 1-2 inches farther away from your buttocks. 4. Hold this position for 3-5 seconds. 5. Slowly lower your hips to the starting position, and allow your buttocks muscles to relax completely. If this exercise is too easy, try  doing it with your arms crossed over your chest. Abdominal crunches Repeat these steps 5-10 times: 1. Lie on your back on a firm bed or the floor with your legs extended. 2. Bend your knees so they are pointing toward the ceiling and your feet are flat on the floor. 3. Cross your arms over your chest. 4. Tip your chin slightly toward your chest without bending your neck. 5. Tighten your abdominal muscles and slowly raise your trunk (torso) high enough to lift your shoulder blades a tiny bit off the floor. Avoid raising your torso higher than that because it can put too much stress on your low back and does not help to strengthen your abdominal muscles. 6. Slowly return to your starting position. Back lifts Repeat these steps 5-10 times: 1. Lie on your abdomen (face-down) with your arms at your sides, and rest your forehead on the floor. 2. Tighten the muscles in your legs and your buttocks. 3. Slowly lift your chest off the floor while you keep your hips pressed to the floor. Keep the back of your head in line with the curve in your back. Your eyes should be looking at the floor. 4. Hold this position for 3-5 seconds. 5. Slowly return to your starting position. Contact a health care provider if:  Your back pain or discomfort gets much worse when you do an exercise.  Your worsening back pain or discomfort does not lessen within 2 hours after you exercise. If you have any of these problems, stop doing these exercises right away. Do not do them again unless your health care provider says that you can. Get help right away if:  You develop sudden, severe back pain. If this happens, stop doing the exercises right away. Do not do them again unless your health care provider says that you can. This information is not intended to replace advice given to you by your health care provider. Make sure you discuss any questions you have with your health care provider. Document Released: 06/08/2004 Document  Revised: 09/05/2018 Document Reviewed: 01/31/2018 Elsevier Patient Education  2020 [employer].  Scoliosis  Scoliosis is a condition in which the spine curves sideways. Normally, the spine does not curve side-to-side (laterally). With scoliosis, the spine may curve to the left, to the right, or in both directions. The curve of the spine is measured by angles in degrees. Scoliosis can affect people at any age, but it is more common among children and adolescents. What are the causes? The cause of scoliosis is not always known. It may be caused by:  A birth defect.  A disease that can cause problems in the muscles or imbalance of the body, such as cerebral palsy or muscular dystrophy. What are the signs or symptoms? This condition may not cause any symptoms. If you do have symptoms, they may include:  Leaning to one  side.  Sunken chest and uneven shoulders.  One side of the body being different or larger than the other side (asymmetry).  An abnormal curve in the back.  Pain, which may limit physical activity.  Shortness of breath.  Bowel or bladder control problems, such as not knowing when you have to go. This can be a sign of nerve damage. How is this diagnosed? This condition is diagnosed based on:  Your medical history.  Your symptoms.  A physical exam. This may include: ? Examining your nerves, muscles, and reflexes (neurological exam). ? Testing the movement of your spine (range of motion study).  Imaging tests, such as: ? X-rays. ? MRI. How is this treated? Treatment for this condition depends on the severity of the symptoms. Treatment may include:  Observation to make sure that your scoliosis does not get worse (progress). You may need to have regular visits with your health care provider.  A back brace to prevent scoliosis from progressing. This may be needed during times of fast growth (growth spurts), such as during adolescence.  Medicine to help relieve  pain.  Physical therapy.  Surgery. Follow these instructions at home: If you have a brace:  Wear the brace as told by your health care provider. Remove it only as told by your health care provider.  Loosen the brace if your fingers or toes tingle, become numb, or turn cold and blue.  Keep the brace clean.  If the brace is not waterproof: ? Do not let it get wet. ? Cover it with a watertight covering when you take a bath or a shower. General instructions  Take over-the-counter and prescription medicines only as told by your health care provider.  Donot drive or use heavy machinery while taking prescription pain medicine.  If physical therapy was prescribed, do exercises as instructed.  Before starting any new sports or physical activities, ask your health care provider whether they are safe for you.  Keep all follow-up visits as told by your health care provider. This is important. Contact a health care provider if you have:  Problems with your back brace, such as skin irritation or discomfort.  Back pain that does not get better with medicine. Get help right away if:  Your legs feel weak.  You cannot move your legs.  You cannot control when you urinate or pass stool (loss of bladder or bowel control). Summary  Scoliosis is a condition of having a spine that curves sideways. The spine may curve to the left, to the right, or in both directions.  This condition may be caused by birth defects or diseases that affect muscles and body balance.  Follow your health care provider's instructions about wearing a brace, doing physical activities, and keeping follow-up visits. This information is not intended to replace advice given to you by your health care provider. Make sure you discuss any questions you have with your health care provider. Document Released: 04/28/2000 Document Revised: 10/01/2017 Document Reviewed: 08/16/2017 Elsevier Patient Education  2020 [employer].

## 2019-01-07 NOTE — Progress Notes (Addendum)
Chief Complaint  Patient presents with  . Follow-up   F/u  1. H/o elevated BP though normal today 124/72 pt did not want to start norvasc 2.5 mg qd, 12/26/18 at [facility] urgent care was 136/80 pt declines to take norvasc 2.5 mg qd for now  2. Low back pain with radiation down to left hip and left leg, UC visit 12/26/18 due to this pain given norvco 5-325 prn, skelaxin (not covered) by insurance and naprosyn which she took but felt 400-600 mg Ibuprofen helped better with pain. Today pain is 1/10, lying down dow not give her relief and ortho Dr. Marry Guan thinks its also coming from her back   Xray abnormal 01/02/19   FINDINGS: Frontal, lateral, spot lumbosacral lateral, and bilateral oblique views were obtained. There are 5 non-rib-bearing lumbar type vertebral bodies. T12 ribs are hypoplastic. There is mild lumbar dextroscoliosis. There is no fracture or spondylolisthesis. There is moderately severe disc space narrowing at L3-4. There is milder disc space narrowing at L1-2 and L4-5. There is facet osteoarthritic change at L4-5 and L5-S1 bilaterally. There are total hip replacements bilaterally.  IMPRESSION: Scoliosis with osteoarthritic change, most notably at L3-4. No fracture or spondylolisthesis. Total hip replacements bilaterally noted.  3. HLD reviewed labs she was on pravastatin in the past but reports she though it affected her memory    Review of Systems  Constitutional: Negative for weight loss.  HENT: Negative for hearing loss.   Eyes: Negative for blurred vision.  Respiratory: Negative for shortness of breath.   Cardiovascular: Negative for chest pain.  Gastrointestinal: Negative for abdominal pain.  Musculoskeletal: Positive for back pain and joint pain.  Skin: Negative for rash.  Neurological: Negative for headaches.  Psychiatric/Behavioral: Negative for depression and memory loss.   Past Medical History:  Diagnosis Date  . Allergic rhinitis   . Anxiety   . Arthritis    Osteoarthritis mild DDD lumbar spine noted in 2018   . Headache   . Stress   . Trochanteric bursitis    Past Surgical History:  Procedure Laterality Date  . CATARACT EXTRACTION     b/l 10 and 04/2018  . CERVICAL CERCLAGE      X 3  . JOINT REPLACEMENT    . TOTAL HIP ARTHROPLASTY Right 08/24/2014   Dr. Marry Guan, [facility]  . TOTAL HIP ARTHROPLASTY Left 09/25/2016   Procedure: TOTAL HIP ARTHROPLASTY;  Surgeon: Dereck Leep, MD;  Location: [facility] ORS;  Service: Orthopedics;  Laterality: Left;   Family History  Problem Relation Age of Onset  . Osteoporosis Mother   . Arthritis/Rheumatoid Mother   . Hyperlipidemia Mother   . Hypertension Father   . Lung cancer Father        deid 81  . Other Father        brain tumor  . Hyperlipidemia Father   . CAD Father        s/p cabg  . Other Sister        brain tumor glioblastoma   . Gout Son   . Other Son        cognitive d/o   Social History   Socioeconomic History  . Marital status: Divorced    Spouse name: Not on file  . Number of children: Not on file  . Years of education: Not on file  . Highest education level: Not on file  Occupational History  . Not on file  Social Needs  . Financial resource strain: Not on file  .  Food insecurity    Worry: Not on file    Inability: Not on file  . Transportation needs    Medical: Not on file    Non-medical: Not on file  Tobacco Use  . Smoking status: Former Smoker    Packs/day: 1.00    Types: Cigarettes    Quit date: 08/13/2014    Years since quitting: 4.4  . Smokeless tobacco: Never Used  Substance and Sexual Activity  . Alcohol use: No  . Drug use: No  . Sexual activity: Not on file  Lifestyle  . Physical activity    Days per week: Not on file    Minutes per session: Not on file  . Stress: Not on file  Relationships  . Social Herbalist on phone: Not on file    Gets together: Not on file    Attends religious service: Not on file    Active member of club or  organization: Not on file    Attends meetings of clubs or organizations: Not on file    Relationship status: Not on file  . Intimate partner violence    Fear of current or ex partner: Not on file    Emotionally abused: Not on file    Physically abused: Not on file    Forced sexual activity: Not on file  Other Topics Concern  . Not on file  Social History Narrative   Lives with long term boyfriend 22 years older than her   1 son special needs lives in own place 73 y.o    3 kids total all sons      Cadience Danh son DPR   No outpatient medications have been marked as taking for the 01/07/19 encounter (Office Visit) with McLean-Scocuzza, Nino Glow, MD.   Allergies  Allergen Reactions  . Paxil [Paroxetine]     Sweating increased    . Wellbutrin [Bupropion] Itching  . Zoloft [Sertraline Hcl] Itching   Recent Results (from the past 2160 hour(s))  Vitamin D (25 hydroxy)     Status: None   Collection Time: 01/02/19  8:54 AM  Result Value Ref Range   VITD 39.99 30.00 - 100.00 ng/mL  Urinalysis, Routine w reflex microscopic     Status: None   Collection Time: 01/02/19  8:54 AM  Result Value Ref Range   Color, Urine YELLOW YELLOW   APPearance CLEAR CLEAR   Specific Gravity, Urine 1.009 1.001 - 1.03   pH 5.5 5.0 - 8.0   Glucose, UA NEGATIVE NEGATIVE   Bilirubin Urine NEGATIVE NEGATIVE   Ketones, ur NEGATIVE NEGATIVE   Hgb urine dipstick NEGATIVE NEGATIVE   Protein, ur NEGATIVE NEGATIVE   Nitrite NEGATIVE NEGATIVE   Leukocytes,Ua NEGATIVE NEGATIVE  TSH     Status: None   Collection Time: 01/02/19  8:54 AM  Result Value Ref Range   TSH 0.74 0.35 - 4.50 uIU/mL  Lipid panel     Status: Abnormal   Collection Time: 01/02/19  8:54 AM  Result Value Ref Range   Cholesterol 242 (H) 0 - 200 mg/dL    Comment: ATP III Classification       Desirable:  < 200 mg/dL               Borderline High:  200 - 239 mg/dL          High:  > = 240 mg/dL   Triglycerides 126.0 0.0 - 149.0 mg/dL     Comment: Normal:  <150 mg/dLBorderline  High:  150 - 199 mg/dL   HDL 57.90 >39.00 mg/dL   VLDL 25.2 0.0 - 40.0 mg/dL   LDL Cholesterol 158 (H) 0 - 99 mg/dL   Total CHOL/HDL Ratio 4     Comment:                Men          Women1/2 Average Risk     3.4          3.3Average Risk          5.0          4.42X Average Risk          9.6          7.13X Average Risk          15.0          11.0                       NonHDL 183.67     Comment: NOTE:  Non-HDL goal should be 30 mg/dL higher than patient's LDL goal (i.e. LDL goal of < 70 mg/dL, would have non-HDL goal of < 100 mg/dL)  CBC with Differential/Platelet     Status: None   Collection Time: 01/02/19  8:54 AM  Result Value Ref Range   WBC 7.9 4.0 - 10.5 K/uL   RBC 4.64 3.87 - 5.11 Mil/uL   Hemoglobin 13.3 12.0 - 15.0 g/dL   HCT 39.5 36.0 - 46.0 %   MCV 85.0 78.0 - 100.0 fl   MCHC 33.7 30.0 - 36.0 g/dL   RDW 14.5 11.5 - 15.5 %   Platelets 331.0 150.0 - 400.0 K/uL   Neutrophils Relative % 57.2 43.0 - 77.0 %   Lymphocytes Relative 31.5 12.0 - 46.0 %   Monocytes Relative 5.3 3.0 - 12.0 %   Eosinophils Relative 4.2 0.0 - 5.0 %   Basophils Relative 1.8 0.0 - 3.0 %   Neutro Abs 4.5 1.4 - 7.7 K/uL   Lymphs Abs 2.5 0.7 - 4.0 K/uL   Monocytes Absolute 0.4 0.1 - 1.0 K/uL   Eosinophils Absolute 0.3 0.0 - 0.7 K/uL   Basophils Absolute 0.1 0.0 - 0.1 K/uL  Comprehensive metabolic panel     Status: None   Collection Time: 01/02/19  8:54 AM  Result Value Ref Range   Sodium 142 135 - 145 mEq/L   Potassium 3.9 3.5 - 5.1 mEq/L   Chloride 105 96 - 112 mEq/L   CO2 28 19 - 32 mEq/L   Glucose, Bld 92 70 - 99 mg/dL   BUN 16 6 - 23 mg/dL   Creatinine, Ser 0.86 0.40 - 1.20 mg/dL   Total Bilirubin 0.5 0.2 - 1.2 mg/dL   Alkaline Phosphatase 89 39 - 117 U/L   AST 19 0 - 37 U/L   ALT 15 0 - 35 U/L   Total Protein 6.8 6.0 - 8.3 g/dL   Albumin 4.5 3.5 - 5.2 g/dL   Calcium 9.9 8.4 - 10.5 mg/dL   GFR 65.92 >60.00 mL/min   Objective  Body mass index is 31.03  kg/m. Wt Readings from Last 3 Encounters:  01/07/19 180 lb 12.8 oz (82 kg)  12/05/18 184 lb (83.5 kg)  09/25/16 178 lb 9.2 oz (81 kg)   Temp Readings from Last 3 Encounters:  01/07/19 98.3 F (36.8 C) (Oral)  12/05/18 99.1 F (37.3 C) (Oral)  09/27/16 97.7 F (36.5 C) (Oral)  BP Readings from Last 3 Encounters:  01/07/19 124/72  01/02/19 (!) 150/70  12/06/18 (!) 154/91   Pulse Readings from Last 3 Encounters:  01/07/19 76  01/02/19 67  12/06/18 (!) 59    Physical Exam Vitals signs and nursing note reviewed.  Constitutional:      Appearance: Normal appearance. She is well-developed and well-groomed. She is obese.  HENT:     Head: Normocephalic and atraumatic.     Comments: +mask on      Ears:   Eyes:     Conjunctiva/sclera: Conjunctivae normal.     Pupils: Pupils are equal, round, and reactive to light.  Cardiovascular:     Rate and Rhythm: Normal rate and regular rhythm.     Heart sounds: Normal heart sounds.  Pulmonary:     Effort: Pulmonary effort is normal.     Breath sounds: Normal breath sounds.  Musculoskeletal:       Arms:  Skin:    General: Skin is warm and dry.       Neurological:     General: No focal deficit present.     Mental Status: She is alert and oriented to person, place, and time. Mental status is at baseline.     Gait: Gait normal.  Psychiatric:        Attention and Perception: Attention and perception normal.        Mood and Affect: Mood and affect normal.        Speech: Speech normal.        Behavior: Behavior normal. Behavior is cooperative.        Thought Content: Thought content normal.        Cognition and Memory: Cognition and memory normal.        Judgment: Judgment normal.     Assessment   1. H/o elevated variable BP pt declines meds norvasc 2.5 mg qd BP normal today 124/72 at [facility] clinic was 136/80 12/26/18  2. 01/02/19 Xray scoliosis with OA L3/4 having lumbar radiculopathy and low back pain radiating to left 3. HLD   4. Lipomas skin, monitor left shin and right upper arm  5. Left ear eczema 6. HM Plan  1. Monitor BP for now 2, check on [facility] referral  Consider MRI in future if not getting better she is s/p total hips b/l  Has prn norco has not taken  Rx zanaflex prn  Given back stretches  Has been taking prn ibuprofen  Declines steroids for now   3. Recheck lipid in 6 months and if not coming down agreeable to low dose statin only was on pravastatin in the past disc livalo and mevacor  4. Monitor  5. rec otc hc oint topical and stop picking at the area  6.  Flu shot discuss today with pt declines high dose but agreeable to regular flu shot given today   disc f/u with prevnar and shingrix  utd Tdap and pna 23   Referred mammogram and  DEXA in 2015 DEXA osteopenia -given # to call and schedule pt unsure If [place] in network for now she is to let me know   Colonoscopy 11/20/13 hyperplastic polyps and anal lesion  -pt considering removal will need referral in future if interested she declines referral today 01/08/19   Pap 10/17/13 negative   hcv neg 09/03/11  Former smoker quit in 2016 smoked x 20 years max 1-2 ppd FH father lung cancer   Skin-no issues as of 01/08/19   Ortho  Dr. Marry Guan   Provider: Dr. Olivia Mackie McLean-Scocuzza-Internal Medicine

## 2019-01-08 NOTE — Progress Notes (Signed)
It is logged correctly in her chart. This is the lot number for the regular flu shot. Not high dose

## 2019-01-09 DIAGNOSIS — H524 Presbyopia: Secondary | ICD-10-CM | POA: Diagnosis not present

## 2019-01-17 DIAGNOSIS — M5432 Sciatica, left side: Secondary | ICD-10-CM | POA: Diagnosis not present

## 2019-01-17 DIAGNOSIS — R262 Difficulty in walking, not elsewhere classified: Secondary | ICD-10-CM | POA: Diagnosis not present

## 2019-01-17 DIAGNOSIS — M25552 Pain in left hip: Secondary | ICD-10-CM | POA: Diagnosis not present

## 2019-01-22 DIAGNOSIS — M25552 Pain in left hip: Secondary | ICD-10-CM | POA: Diagnosis not present

## 2019-01-22 DIAGNOSIS — R262 Difficulty in walking, not elsewhere classified: Secondary | ICD-10-CM | POA: Diagnosis not present

## 2019-01-22 DIAGNOSIS — M5432 Sciatica, left side: Secondary | ICD-10-CM | POA: Diagnosis not present

## 2019-01-24 DIAGNOSIS — R262 Difficulty in walking, not elsewhere classified: Secondary | ICD-10-CM | POA: Diagnosis not present

## 2019-01-24 DIAGNOSIS — M5432 Sciatica, left side: Secondary | ICD-10-CM | POA: Diagnosis not present

## 2019-01-24 DIAGNOSIS — M25552 Pain in left hip: Secondary | ICD-10-CM | POA: Diagnosis not present

## 2019-01-25 IMAGING — MG MM DIGITAL SCREENING BILAT W/ CAD
4 series · 4 of 4 positions shown · non-contrast
Comparison: Previous exam(s).

CLINICAL DATA: Screening.

EXAM:
DIGITAL SCREENING BILATERAL MAMMOGRAM WITH CAD

[R CC]
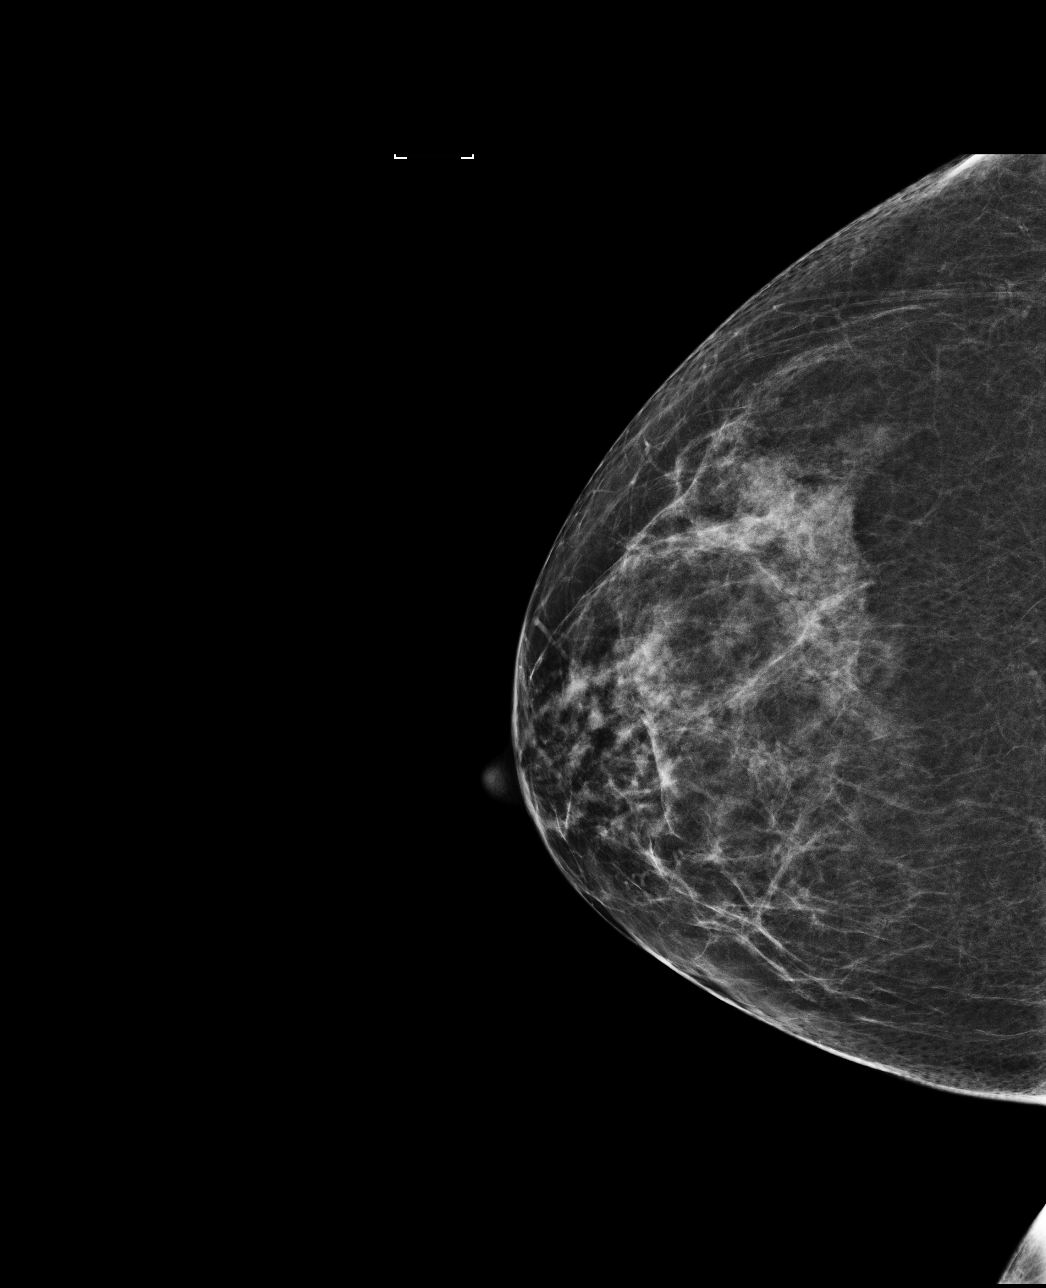

[L CC]
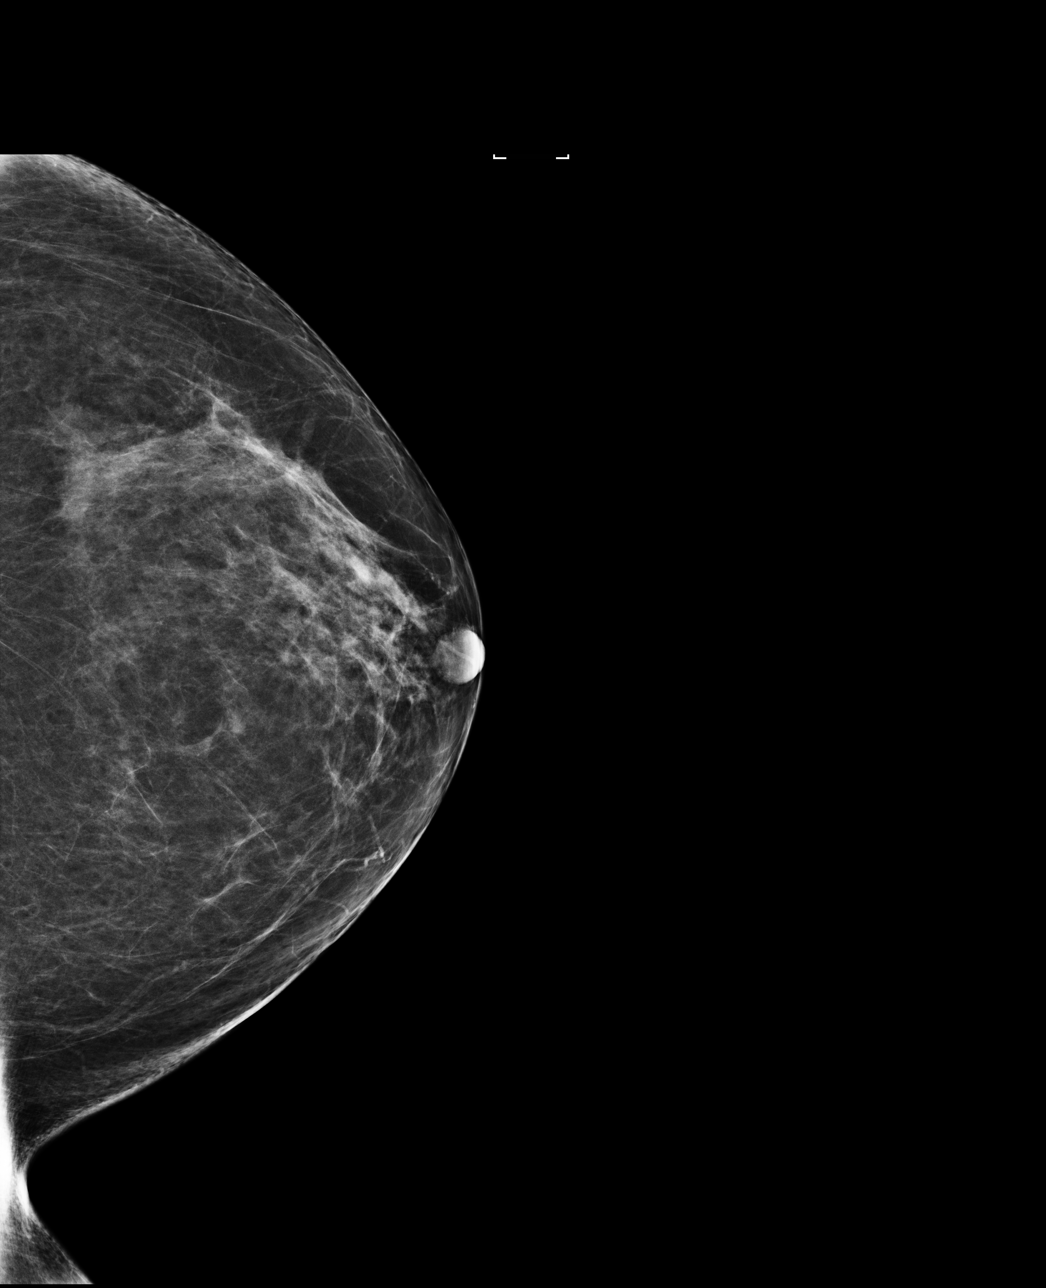

[R MLO]
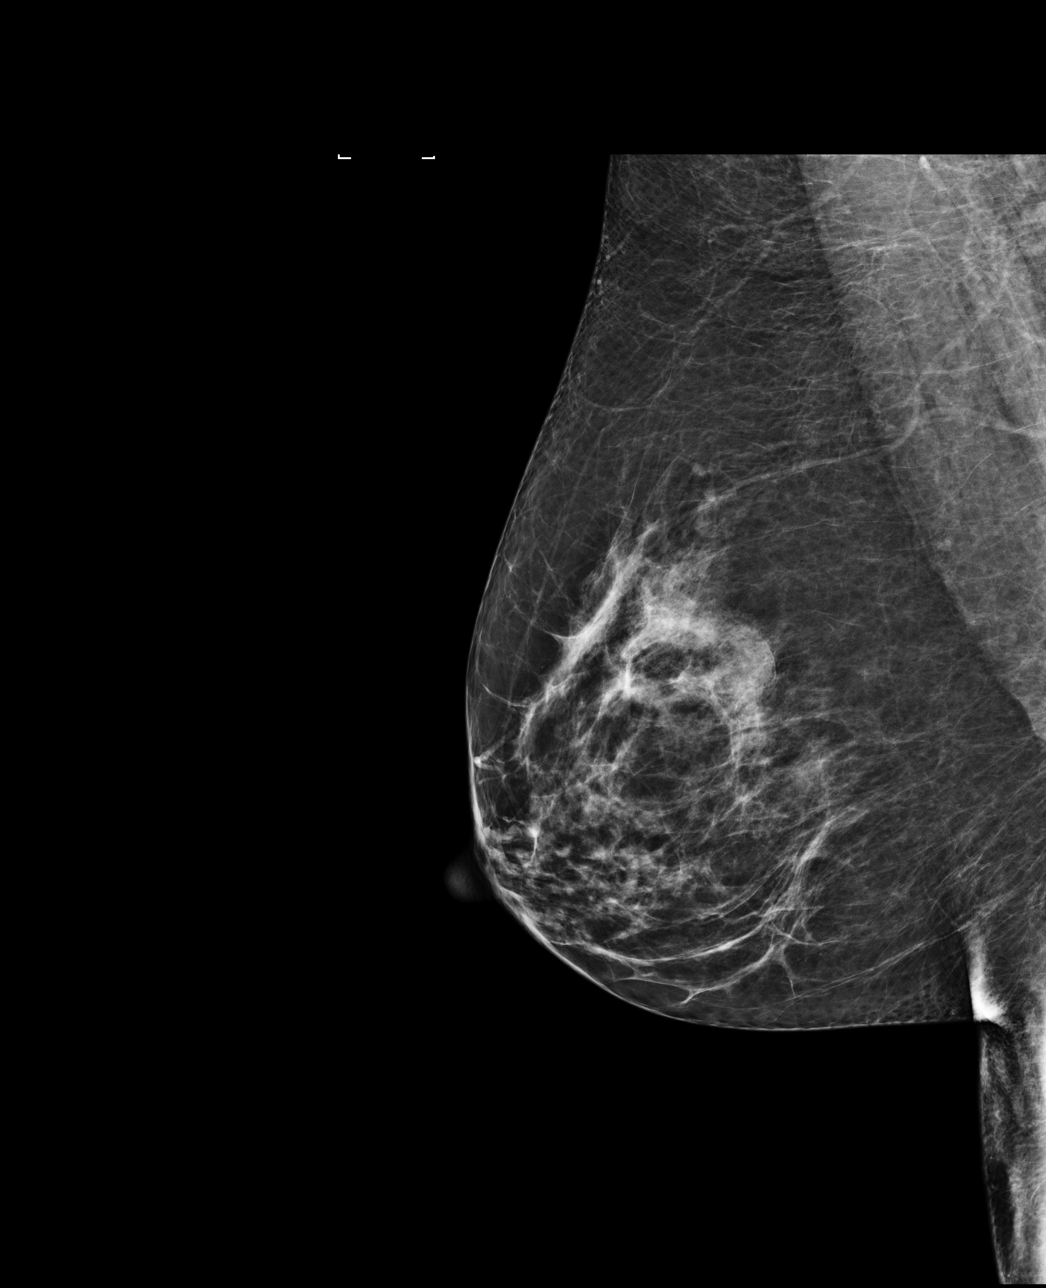

[L MLO]
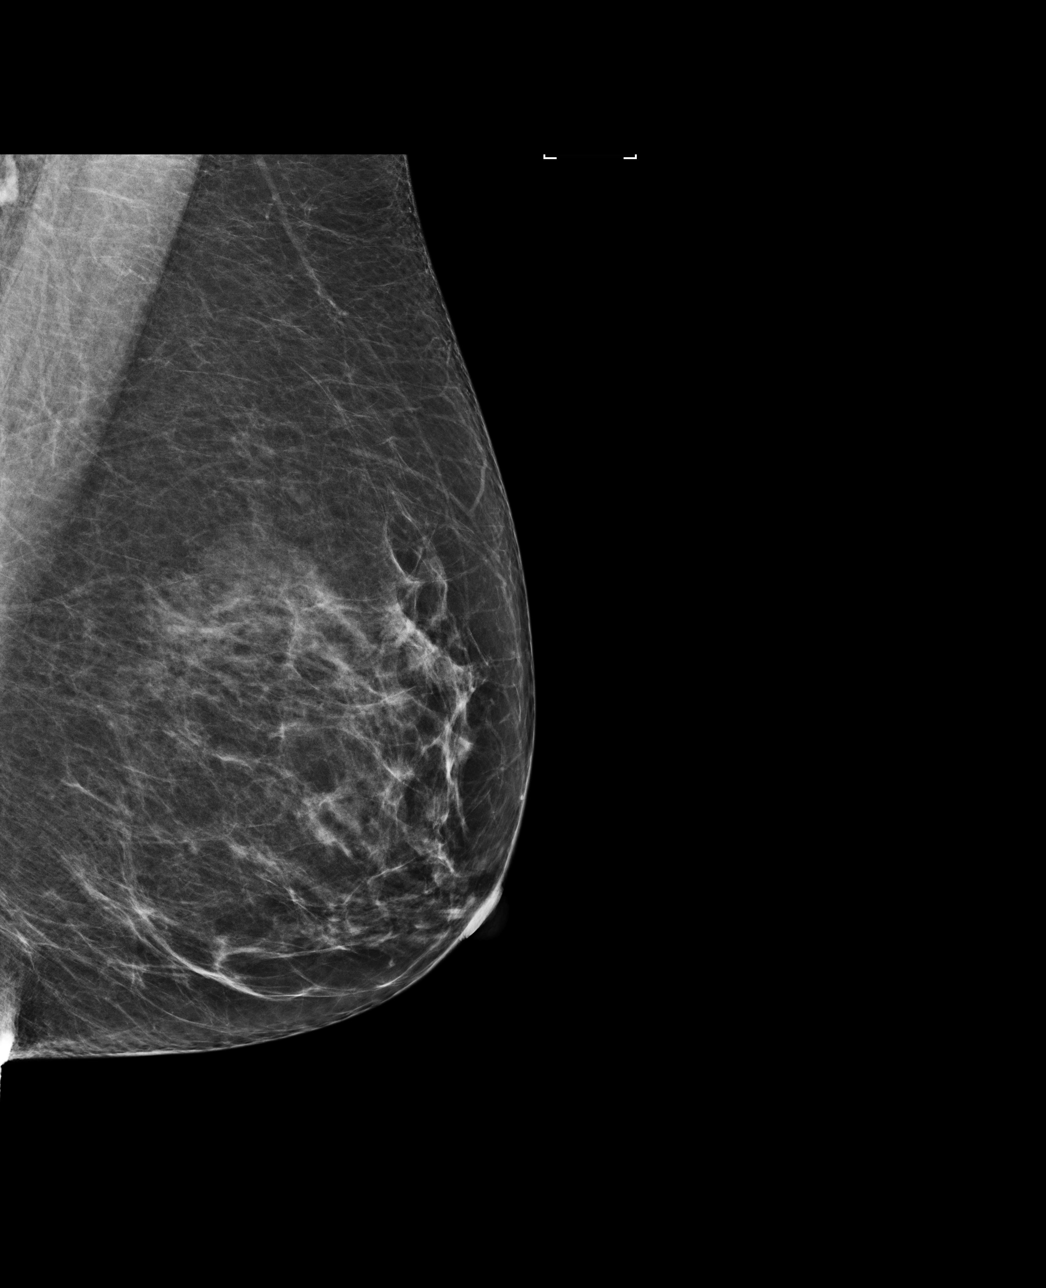

[4 of 4 positions shown; findings below may reference images not displayed]

ACR Breast Density Category b: There are scattered areas of
fibroglandular density.
FINDINGS: There are no findings suspicious for malignancy. Images were
processed with CAD.
IMPRESSION: No mammographic evidence of malignancy. A result letter of this
screening mammogram will be mailed directly to the patient.

RECOMMENDATION:
Screening mammogram in one year. (Code:AS-G-LCT)

BI-RADS CATEGORY  1: Negative.

## 2019-01-27 DIAGNOSIS — M25552 Pain in left hip: Secondary | ICD-10-CM | POA: Diagnosis not present

## 2019-01-27 DIAGNOSIS — M5432 Sciatica, left side: Secondary | ICD-10-CM | POA: Diagnosis not present

## 2019-01-27 DIAGNOSIS — R262 Difficulty in walking, not elsewhere classified: Secondary | ICD-10-CM | POA: Diagnosis not present

## 2019-01-31 ENCOUNTER — Ambulatory Visit
Admission: RE | Admit: 2019-01-31 | Discharge: 2019-01-31 | Disposition: A | Discharge status: Home / Self Care | Payer: Medicare HMO | Source: Ambulatory Visit | Attending: Internal Medicine | Admitting: Internal Medicine

## 2019-01-31 DIAGNOSIS — E2839 Other primary ovarian failure: Secondary | ICD-10-CM

## 2019-01-31 DIAGNOSIS — Z780 Asymptomatic menopausal state: Secondary | ICD-10-CM | POA: Diagnosis not present

## 2019-01-31 DIAGNOSIS — M8589 Other specified disorders of bone density and structure, multiple sites: Secondary | ICD-10-CM | POA: Diagnosis not present

## 2019-01-31 DIAGNOSIS — Z1231 Encounter for screening mammogram for malignant neoplasm of breast: Secondary | ICD-10-CM | POA: Diagnosis not present

## 2019-02-03 ENCOUNTER — Other Ambulatory Visit: Payer: Self-pay | Admitting: Internal Medicine

## 2019-02-03 DIAGNOSIS — N6489 Other specified disorders of breast: Secondary | ICD-10-CM

## 2019-02-03 DIAGNOSIS — N6310 Unspecified lump in the right breast, unspecified quadrant: Secondary | ICD-10-CM

## 2019-02-03 DIAGNOSIS — R928 Other abnormal and inconclusive findings on diagnostic imaging of breast: Secondary | ICD-10-CM

## 2019-02-05 DIAGNOSIS — R262 Difficulty in walking, not elsewhere classified: Secondary | ICD-10-CM | POA: Diagnosis not present

## 2019-02-05 DIAGNOSIS — M5432 Sciatica, left side: Secondary | ICD-10-CM | POA: Diagnosis not present

## 2019-02-05 DIAGNOSIS — M25552 Pain in left hip: Secondary | ICD-10-CM | POA: Diagnosis not present

## 2019-02-11 ENCOUNTER — Ambulatory Visit
Admission: RE | Admit: 2019-02-11 | Discharge: 2019-02-11 | Disposition: A | Discharge status: Home / Self Care | Payer: Medicare HMO | Source: Ambulatory Visit | Attending: Internal Medicine | Admitting: Internal Medicine

## 2019-02-11 DIAGNOSIS — R928 Other abnormal and inconclusive findings on diagnostic imaging of breast: Secondary | ICD-10-CM

## 2019-02-11 DIAGNOSIS — N6310 Unspecified lump in the right breast, unspecified quadrant: Secondary | ICD-10-CM | POA: Insufficient documentation

## 2019-02-11 DIAGNOSIS — N6489 Other specified disorders of breast: Secondary | ICD-10-CM

## 2019-02-12 DIAGNOSIS — M5432 Sciatica, left side: Secondary | ICD-10-CM | POA: Diagnosis not present

## 2019-02-12 DIAGNOSIS — R262 Difficulty in walking, not elsewhere classified: Secondary | ICD-10-CM | POA: Diagnosis not present

## 2019-02-12 DIAGNOSIS — M25552 Pain in left hip: Secondary | ICD-10-CM | POA: Diagnosis not present

## 2019-02-19 DIAGNOSIS — R262 Difficulty in walking, not elsewhere classified: Secondary | ICD-10-CM | POA: Diagnosis not present

## 2019-02-19 DIAGNOSIS — M25552 Pain in left hip: Secondary | ICD-10-CM | POA: Diagnosis not present

## 2019-02-19 DIAGNOSIS — M5432 Sciatica, left side: Secondary | ICD-10-CM | POA: Diagnosis not present

## 2019-02-26 DIAGNOSIS — R262 Difficulty in walking, not elsewhere classified: Secondary | ICD-10-CM | POA: Diagnosis not present

## 2019-02-26 DIAGNOSIS — M5432 Sciatica, left side: Secondary | ICD-10-CM | POA: Diagnosis not present

## 2019-02-26 DIAGNOSIS — M25552 Pain in left hip: Secondary | ICD-10-CM | POA: Diagnosis not present

## 2019-04-09 ENCOUNTER — Ambulatory Visit: Payer: Medicare HMO | Admitting: Internal Medicine

## 2019-07-03 ENCOUNTER — Encounter: Payer: Self-pay | Admitting: Internal Medicine

## 2019-07-03 ENCOUNTER — Ambulatory Visit (INDEPENDENT_AMBULATORY_CARE_PROVIDER_SITE_OTHER): Payer: Medicare HMO | Admitting: Internal Medicine

## 2019-07-03 ENCOUNTER — Other Ambulatory Visit: Payer: Self-pay

## 2019-07-03 VITALS — BP 119/70 | HR 66 | Ht 64.0 in | Wt 180.8 lb

## 2019-07-03 DIAGNOSIS — E785 Hyperlipidemia, unspecified: Secondary | ICD-10-CM | POA: Diagnosis not present

## 2019-07-03 DIAGNOSIS — N3000 Acute cystitis without hematuria: Secondary | ICD-10-CM | POA: Diagnosis not present

## 2019-07-03 DIAGNOSIS — N6310 Unspecified lump in the right breast, unspecified quadrant: Secondary | ICD-10-CM | POA: Diagnosis not present

## 2019-07-03 MED ORDER — FLUCONAZOLE 150 MG PO TABS: 150.0000 mg | ORAL_TABLET | Freq: Once | ORAL | 0 refills | Status: AC

## 2019-07-03 MED ORDER — AMOXICILLIN-POT CLAVULANATE 875-125 MG PO TABS: 1.0000 | ORAL_TABLET | Freq: Two times a day (BID) | ORAL | 0 refills | Status: DC

## 2019-07-03 NOTE — Patient Instructions (Addendum)
High Cholesterol  High cholesterol is a condition in which the blood has high levels of a white, waxy, fat-like substance (cholesterol). The human body needs small amounts of cholesterol. The liver makes all the cholesterol that the body needs. Extra (excess) cholesterol comes from the food that we eat. Cholesterol is carried from the liver by the blood through the blood vessels. If you have high cholesterol, deposits (plaques) may build up on the walls of your blood vessels (arteries). Plaques make the arteries narrower and stiffer. Cholesterol plaques increase your risk for heart attack and stroke. Work with your health care provider to keep your cholesterol levels in a healthy range. What increases the risk? This condition is more likely to develop in people who:  Eat foods that are high in animal fat (saturated fat) or cholesterol.  Are overweight.  Are not getting enough exercise.  Have a family history of high cholesterol. What are the signs or symptoms? There are no symptoms of this condition. How is this diagnosed? This condition may be diagnosed from the results of a blood test.  If you are older than age 66, your health care provider may check your cholesterol every 4-6 years.  You may be checked more often if you already have high cholesterol or other risk factors for heart disease. The blood test for cholesterol measures:  "Bad" cholesterol (LDL cholesterol). This is the main type of cholesterol that causes heart disease. The desired level for LDL is less than 100.  "Good" cholesterol (HDL cholesterol). This type helps to protect against heart disease by cleaning the arteries and carrying the LDL away. The desired level for HDL is 60 or higher.  Triglycerides. These are fats that the body can store or burn for energy. The desired number for triglycerides is lower than 150.  Total cholesterol. This is a measure of the total amount of cholesterol in your blood, including  LDL cholesterol, HDL cholesterol, and triglycerides. A healthy number is less than 200. How is this treated? This condition is treated with diet changes, lifestyle changes, and medicines. Diet changes  This may include eating more whole grains, fruits, vegetables, nuts, and fish.  This may also include cutting back on red meat and foods that have a lot of added sugar. Lifestyle changes  Changes may include getting at least 40 minutes of aerobic exercise 3 times a week. Aerobic exercises include walking, biking, and swimming. Aerobic exercise along with a healthy diet can help you maintain a healthy weight.  Changes may also include quitting smoking. Medicines  Medicines are usually given if diet and lifestyle changes have failed to reduce your cholesterol to healthy levels.  Your health care provider may prescribe a statin medicine. Statin medicines have been shown to reduce cholesterol, which can reduce the risk of heart disease. Follow these instructions at home: Eating and drinking If told by your health care provider:  Eat chicken (without skin), fish, veal, shellfish, ground [place] breast, and round or loin cuts of red meat.  Do not eat fried foods or fatty meats, such as hot dogs and salami.  Eat plenty of fruits, such as apples.  Eat plenty of vegetables, such as broccoli, potatoes, and carrots.  Eat beans, peas, and lentils.  Eat grains such as barley, rice, couscous, and bulgur wheat.  Eat pasta without cream sauces.  Use skim or nonfat milk, and eat low-fat or nonfat yogurt and cheeses.  Do not eat or drink whole milk, cream, ice cream, egg  yolks, or hard cheeses.  Do not eat stick margarine or tub margarines that contain trans fats (also called partially hydrogenated oils).  Do not eat saturated tropical oils, such as coconut oil and palm oil.  Do not eat cakes, cookies, crackers, or other baked goods that contain trans fats.  General instructions  Exercise  as directed by your health care provider. Increase your activity level with activities such as gardening, walking, and taking the stairs.  Take over-the-counter and prescription medicines only as told by your health care provider.  Do not use any products that contain nicotine or tobacco, such as cigarettes and e-cigarettes. If you need help quitting, ask your health care provider.  Keep all follow-up visits as told by your health care provider. This is important. Contact a health care provider if:  You are struggling to maintain a healthy diet or weight.  You need help to start on an exercise program.  You need help to stop smoking. Get help right away if:  You have chest pain.  You have trouble breathing. This information is not intended to replace advice given to you by your health care provider. Make sure you discuss any questions you have with your health care provider. Document Revised: 05/04/2017 Document Reviewed: 10/30/2015 Elsevier Patient Education  [place].  Cholesterol Content in Foods Cholesterol is a waxy, fat-like substance that helps to carry fat in the blood. The body needs cholesterol in small amounts, but too much cholesterol can cause damage to the arteries and heart. Most people should eat less than 200 milligrams (mg) of cholesterol a day. Foods with cholesterol  Cholesterol is found in animal-based foods, such as meat, seafood, and dairy. Generally, low-fat dairy and lean meats have less cholesterol than full-fat dairy and fatty meats. The milligrams of cholesterol per serving (mg per serving) of common cholesterol-containing foods are listed below. Meat and other proteins  Egg -- one large whole egg has 186 mg.  Veal shank -- 4 oz has 141 mg.  Lean ground [place] (93% lean) -- 4 oz has 118 mg.  Fat-trimmed lamb loin -- 4 oz has 106 mg.  Lean ground beef (90% lean) -- 4 oz has 100 mg.  Lobster -- 3.5 oz has 90 mg.  Pork loin chops -- 4 oz  has 86 mg.  Canned salmon -- 3.5 oz has 83 mg.  Fat-trimmed beef top loin -- 4 oz has 78 mg.  Frankfurter -- 1 frank (3.5 oz) has 77 mg.  Crab -- 3.5 oz has 71 mg.  Roasted chicken without skin, white meat -- 4 oz has 66 mg.  Light bologna -- 2 oz has 45 mg.  Deli-cut [place] -- 2 oz has 31 mg.  Canned tuna -- 3.5 oz has 31 mg.  Berniece Salines -- 1 oz has 29 mg.  Oysters and mussels (raw) -- 3.5 oz has 25 mg.  Mackerel -- 1 oz has 22 mg.  Trout -- 1 oz has 20 mg.  Pork sausage -- 1 link (1 oz) has 17 mg.  Salmon -- 1 oz has 16 mg.  Tilapia -- 1 oz has 14 mg. Dairy  Soft-serve ice cream --  cup (4 oz) has 103 mg.  Whole-milk yogurt -- 1 cup (8 oz) has 29 mg.  Cheddar cheese -- 1 oz has 28 mg.  American cheese -- 1 oz has 28 mg.  Whole milk -- 1 cup (8 oz) has 23 mg.  2% milk -- 1 cup (8 oz) has  18 mg.  Cream cheese -- 1 tablespoon (Tbsp) has 15 mg.  Cottage cheese --  cup (4 oz) has 14 mg.  Low-fat (1%) milk -- 1 cup (8 oz) has 10 mg.  Sour cream -- 1 Tbsp has 8.5 mg.  Low-fat yogurt -- 1 cup (8 oz) has 8 mg.  Nonfat Greek yogurt -- 1 cup (8 oz) has 7 mg.  Half-and-half cream -- 1 Tbsp has 5 mg. Fats and oils  Cod liver oil -- 1 tablespoon (Tbsp) has 82 mg.  Butter -- 1 Tbsp has 15 mg.  Lard -- 1 Tbsp has 14 mg.  Bacon grease -- 1 Tbsp has 14 mg.  Mayonnaise -- 1 Tbsp has 5-10 mg.  Margarine -- 1 Tbsp has 3-10 mg. Exact amounts of cholesterol in these foods may vary depending on specific ingredients and brands. Foods without cholesterol Most plant-based foods do not have cholesterol unless you combine them with a food that has cholesterol. Foods without cholesterol include:  Grains and cereals.  Vegetables.  Fruits.  Vegetable oils, such as olive, canola, and sunflower oil.  Legumes, such as peas, beans, and lentils.  Nuts and seeds.  Egg whites. Summary  The body needs cholesterol in small amounts, but too much cholesterol can cause  damage to the arteries and heart.  Most people should eat less than 200 milligrams (mg) of cholesterol a day. This information is not intended to replace advice given to you by your health care provider. Make sure you discuss any questions you have with your health care provider. Document Revised: 04/13/2017 Document Reviewed: 12/26/2016 Elsevier Patient Education  [place].    Urinary Tract Infection, Adult  A urinary tract infection (UTI) is an infection of any part of the urinary tract. The urinary tract includes the kidneys, ureters, bladder, and urethra. These organs make, store, and get rid of urine in the body. Your health care provider may use other names to describe the infection. An upper UTI affects the ureters and kidneys (pyelonephritis). A lower UTI affects the bladder (cystitis) and urethra (urethritis). What are the causes? Most urinary tract infections are caused by bacteria in your genital area, around the entrance to your urinary tract (urethra). These bacteria grow and cause inflammation of your urinary tract. What increases the risk? You are more likely to develop this condition if:  You have a urinary catheter that stays in place (indwelling).  You are not able to control when you urinate or have a bowel movement (you have incontinence).  You are female and you: ? Use a spermicide or diaphragm for birth control. ? Have low estrogen levels. ? Are pregnant.  You have certain genes that increase your risk (genetics).  You are sexually active.  You take antibiotic medicines.  You have a condition that causes your flow of urine to slow down, such as: ? An enlarged prostate, if you are female. ? Blockage in your urethra (stricture). ? A kidney stone. ? A nerve condition that affects your bladder control (neurogenic bladder). ? Not getting enough to drink, or not urinating often.  You have certain medical conditions, such as: ? Diabetes. ? A weak  disease-fighting system (immunesystem). ? Sickle cell disease. ? Gout. ? Spinal cord injury. What are the signs or symptoms? Symptoms of this condition include:  Needing to urinate right away (urgently).  Frequent urination or passing small amounts of urine frequently.  Pain or burning with urination.  Blood in the urine.  Urine that  smells bad or unusual.  Trouble urinating.  Cloudy urine.  Vaginal discharge, if you are female.  Pain in the abdomen or the lower back. You may also have:  Vomiting or a decreased appetite.  Confusion.  Irritability or tiredness.  A fever.  Diarrhea. The first symptom in older adults may be confusion. In some cases, they may not have any symptoms until the infection has worsened. How is this diagnosed? This condition is diagnosed based on your medical history and a physical exam. You may also have other tests, including:  Urine tests.  Blood tests.  Tests for sexually transmitted infections (STIs). If you have had more than one UTI, a cystoscopy or imaging studies may be done to determine the cause of the infections. How is this treated? Treatment for this condition includes:  Antibiotic medicine.  Over-the-counter medicines to treat discomfort.  Drinking enough water to stay hydrated. If you have frequent infections or have other conditions such as a kidney stone, you may need to see a health care provider who specializes in the urinary tract (urologist). In rare cases, urinary tract infections can cause sepsis. Sepsis is a life-threatening condition that occurs when the body responds to an infection. Sepsis is treated in the hospital with IV antibiotics, fluids, and other medicines. Follow these instructions at home:  Medicines  Take over-the-counter and prescription medicines only as told by your health care provider.  If you were prescribed an antibiotic medicine, take it as told by your health care provider. Do not stop  using the antibiotic even if you start to feel better. General instructions  Make sure you: ? Empty your bladder often and completely. Do not hold urine for long periods of time. ? Empty your bladder after sex. ? Wipe from front to back after a bowel movement if you are female. Use each tissue one time when you wipe.  Drink enough fluid to keep your urine pale yellow.  Keep all follow-up visits as told by your health care provider. This is important. Contact a health care provider if:  Your symptoms do not get better after 1-2 days.  Your symptoms go away and then return. Get help right away if you have:  Severe pain in your back or your lower abdomen.  A fever.  Nausea or vomiting. Summary  A urinary tract infection (UTI) is an infection of any part of the urinary tract, which includes the kidneys, ureters, bladder, and urethra.  Most urinary tract infections are caused by bacteria in your genital area, around the entrance to your urinary tract (urethra).  Treatment for this condition often includes antibiotic medicines.  If you were prescribed an antibiotic medicine, take it as told by your health care provider. Do not stop using the antibiotic even if you start to feel better.  Keep all follow-up visits as told by your health care provider. This is important. This information is not intended to replace advice given to you by your health care provider. Make sure you discuss any questions you have with your health care provider. Document Revised: 04/18/2018 Document Reviewed: 11/08/2017 Elsevier Patient Education  2020 [employer].

## 2019-07-03 NOTE — Progress Notes (Signed)
Telephone Note  I connected with Pota Hanis  on 07/03/19 at  9:40 AM EST by telephone and verified that I am speaking with the correct person using two identifiers.  Location patient: home Location Emila Steinhauser:work or home office Persons participating in the virtual visit: patient, Henrietta Cieslewicz  I discussed the limitations of evaluation and management by telemedicine and the availability of in person appointments. The patient expressed understanding and agreed to proceed.   HPI: UTI sx's x 2 days since 06/30/10 burning after urine stream  Has had 2 UTI before  Amoxicillin>yeast infection in the past   HLD need to repeat this   09/2019 appt with ortho Dr. Marry Guan s/p hip replacement   Right breast mass needs 6 month f/u dx mammogram [place]   ROS: See pertinent positives and negatives per HPI.  Past Medical History:  Diagnosis Date  . Allergic rhinitis   . Anxiety   . Arthritis    Osteoarthritis mild DDD lumbar spine noted in 2018   . Headache   . Stress   . Trochanteric bursitis     Past Surgical History:  Procedure Laterality Date  . CATARACT EXTRACTION     b/l 10 and 04/2018  . CERVICAL CERCLAGE      X 3  . JOINT REPLACEMENT    . TOTAL HIP ARTHROPLASTY Right 08/24/2014   Dr. Marry Guan, [facility]  . TOTAL HIP ARTHROPLASTY Left 09/25/2016   Procedure: TOTAL HIP ARTHROPLASTY;  Surgeon: Dereck Leep, MD;  Location: [facility] ORS;  Service: Orthopedics;  Laterality: Left;    Family History  Problem Relation Age of Onset  . Osteoporosis Mother   . Arthritis/Rheumatoid Mother   . Hyperlipidemia Mother   . Hypertension Father   . Lung cancer Father        deid 32  . Other Father        brain tumor  . Hyperlipidemia Father   . CAD Father        s/p cabg  . Other Sister        brain tumor glioblastoma   . Gout Son   . Other Son        cognitive d/o    SOCIAL HX:   Lives with long term boyfriend 45 years older than her 1 son special needs lives in own place 46 y.o  3  kids total all sons  Deshai Cruver son DPR  Current Outpatient Medications:  .  Artificial Tear Solution (SOOTHE XP) SOLN, Apply 1-2 drops to eye as needed (dry eye)., Disp: , Rfl:  .  Calcium-Vitamins C & D (CALCIUM/C/D PO), Take by mouth., Disp: , Rfl:  .  Cholecalciferol (VITAMIN D3) 2000 units capsule, Take 2,000 Units by mouth daily., Disp: , Rfl:  .  FIBER ADULT GUMMIES PO, Take 1 tablet by mouth daily., Disp: , Rfl:  .  Ketotifen Fumarate (EYE ITCH RELIEF OP), Place 1 drop into both eyes daily., Disp: , Rfl:  .  Multiple Vitamin (MULTIVITAMIN ADULT PO), Take by mouth., Disp: , Rfl:  .  acetaminophen (TYLENOL) 500 MG tablet, Take 500 mg by mouth every 6 (six) hours as needed for mild pain., Disp: , Rfl:  .  amoxicillin-clavulanate (AUGMENTIN) 875-125 MG tablet, Take 1 tablet by mouth 2 (two) times daily., Disp: 14 tablet, Rfl: 0 .  fexofenadine (ALLEGRA) 180 MG tablet, Take 180 mg by mouth daily as needed for allergies or rhinitis., Disp: , Rfl:  .  fluconazole (DIFLUCAN) 150 MG tablet, Take 1 tablet (150  mg total) by mouth once for 1 dose., Disp: 1 tablet, Rfl: 0 .  Glucosamine-Chondroitin (GLUCOSAMINE CHONDR COMPLEX PO), Take 1 tablet by mouth daily., Disp: , Rfl:   EXAM:  VITALS per patient if applicable:  GENERAL: alert, oriented, appears well and in no acute distress  MS: moves all visible extremities without noticeable abnormality  PSYCH/NEURO: pleasant and cooperative, no obvious depression or anxiety, speech and thought processing grossly intact  ASSESSMENT AND PLAN:  Discussed the following assessment and plan:  Acute cystitis without hematuria - Plan: Urinalysis, Routine w reflex microscopic, Urine Culture, amoxicillin-clavulanate (AUGMENTIN) 875-125 MG tablet, fluconazole (DIFLUCAN) 150 MG tablet  Hyperlipidemia, unspecified hyperlipidemia type - Plan: Lipid panel Given cholesterol info   Breast mass, right likely benign - Plan: MM DIAG BREAST TOMO UNI RIGHT Flu  shot discuss today with pt declines high dose but agreeable to regular flu shot given today   HM Flu shot 01/07/19  disc f/u with prevnar and shingrix  utd Tdap and pna 23  Considering covid 19 vaccine  02/11/19 had mammogram due repeat dx right mammo 6 month f/u 08/11/19 DEXA in 2015 DEXA osteopenia DEXA 01/31/19 osteopenia   Colonoscopy 11/20/13 hyperplastic polyps and anal lesion  -pt considering removal will need referral in future if interested she declines referral today 01/08/19   Pap 10/17/13 negative   hcv neg 09/03/11  Former smoker quit in 2016 smoked x 20 years max 1-2 ppd FH father lung cancer   Skin-no issues as of 01/08/19   Ortho Dr. Esmond Harps f/u 09/2019   -we discussed possible serious and likely etiologies, options for evaluation and workup, limitations of telemedicine visit vs in person visit, treatment, treatment risks and precautions. Pt prefers to treat via telemedicine empirically rather then risking or undertaking an in person visit at this moment. Patient agrees to seek prompt in person care if worsening, new symptoms arise, or if is not improving with treatment.   I discussed the assessment and treatment plan with the patient. The patient was provided an opportunity to ask questions and all were answered. The patient agreed with the plan and demonstrated an understanding of the instructions.   The patient was advised to call back or seek an in-person evaluation if the symptoms worsen or if the condition fails to improve as anticipated.  Time spent 20 minutes  Delorise Jackson, MD

## 2019-07-04 DIAGNOSIS — E785 Hyperlipidemia, unspecified: Secondary | ICD-10-CM | POA: Diagnosis not present

## 2019-07-04 DIAGNOSIS — N3000 Acute cystitis without hematuria: Secondary | ICD-10-CM | POA: Diagnosis not present

## 2019-07-06 LAB — LIPID PANEL
Chol/HDL Ratio: 3.6 ratio (ref 0.0–4.4)
Cholesterol, Total: 254 mg/dL — ABNORMAL HIGH (ref 100–199)
HDL: 70 mg/dL (ref >39)
LDL Chol Calc (NIH): 160 mg/dL — ABNORMAL HIGH (ref 0–99)
Triglycerides: 138 mg/dL (ref 0–149)
VLDL Cholesterol Cal: 24 mg/dL (ref 5–40)

## 2019-07-06 LAB — URINALYSIS, ROUTINE W REFLEX MICROSCOPIC
Bilirubin, UA: NEGATIVE
Glucose, UA: NEGATIVE
Ketones, UA: NEGATIVE
Nitrite, UA: NEGATIVE
Protein,UA: NEGATIVE
Specific Gravity, UA: 1.008 (ref 1.005–1.030)
Urobilinogen, Ur: 0.2 mg/dL (ref 0.2–1.0)
pH, UA: 6.5 (ref 5.0–7.5)

## 2019-07-06 LAB — MICROSCOPIC EXAMINATION
Casts: NONE SEEN /lpf
WBC, UA: >30 /hpf — AB (ref 0–5)

## 2019-07-06 LAB — URINE CULTURE

## 2019-07-07 ENCOUNTER — Telehealth: Payer: Self-pay | Admitting: Internal Medicine

## 2019-07-07 NOTE — Telephone Encounter (Signed)
Pt was returning your call.

## 2019-07-07 NOTE — Telephone Encounter (Signed)
Brat diet  Bland (broth, rice, applesauce, crackers, toast, ginger ale, bananas, water/low sugar gatorade)  Immodium otc can help in addition to probiotics but if diarrhea continues rec revisit me for follow up   [place]

## 2019-07-07 NOTE — Telephone Encounter (Signed)
Patient informed and verbalized understanding

## 2019-07-07 NOTE — Telephone Encounter (Signed)
[name], Shannon Glow, MD  07/06/2019  9:22 PM EST    E Coli UTI Augmentin should treat  Let me know if not better    Cholesterol elevated  -mail cholesterol handout  Rec healthy diet and exercise    Called patient back and informed of these results. Cholesterol Information mailed to the patient.   Patient states the UTI symptoms are better. Patient states that she is having diarrhea due to the antibiotic. She is wondering what she can do OTC to help with this? She states she currently takes a probiotic.  Please advise

## 2019-07-24 ENCOUNTER — Telehealth: Payer: Self-pay | Admitting: Internal Medicine

## 2019-07-24 NOTE — Telephone Encounter (Signed)
Left message for patient to call back and schedule Medicare Annual Wellness Visit (AWV) either virtually or audio only.  No hx of AWV; please schedule at anytime with Denisa O'Brien-Blaney at [place]   

## 2019-08-02 ENCOUNTER — Ambulatory Visit: Payer: Medicare HMO | Attending: Internal Medicine

## 2019-08-02 DIAGNOSIS — Z23 Encounter for immunization: Secondary | ICD-10-CM

## 2019-08-02 NOTE — Progress Notes (Signed)
   Covid-19 Vaccination Clinic  Name:  Shannon [name]    MRN: [id number] DOB: 15-Jan-1951  08/02/2019  [name] was observed post Covid-19 immunization for 15 minutes without incident. She was provided with Vaccine Information Sheet and instruction to access the V-Safe system.   [name] was instructed to call 911 with any severe reactions post vaccine: Marland Kitchen Difficulty breathing  . Swelling of face and throat  . A fast heartbeat  . A bad rash all over body  . Dizziness and weakness   Immunizations Administered    Name Date Dose VIS Date Route   Pfizer COVID-19 Vaccine 08/02/2019  9:40 AM 0.3 mL 04/25/2019 Intramuscular   Manufacturer: [place]   Lot: [id number]   [place]: [id number]

## 2019-08-12 ENCOUNTER — Telehealth: Payer: Self-pay | Admitting: Internal Medicine

## 2019-08-12 ENCOUNTER — Telehealth: Payer: Self-pay

## 2019-08-12 DIAGNOSIS — N6310 Unspecified lump in the right breast, unspecified quadrant: Secondary | ICD-10-CM

## 2019-08-12 NOTE — Telephone Encounter (Signed)
I called pt and left a vm with appt time date and location.

## 2019-08-12 NOTE — Telephone Encounter (Signed)
Added [place] order per referral coordinator

## 2019-08-21 ENCOUNTER — Ambulatory Visit
Admission: RE | Admit: 2019-08-21 | Discharge: 2019-08-21 | Disposition: A | Discharge status: Home / Self Care | Payer: Medicare HMO | Source: Ambulatory Visit | Attending: Internal Medicine | Admitting: Internal Medicine

## 2019-08-21 DIAGNOSIS — R922 Inconclusive mammogram: Secondary | ICD-10-CM | POA: Diagnosis not present

## 2019-08-21 DIAGNOSIS — N6310 Unspecified lump in the right breast, unspecified quadrant: Secondary | ICD-10-CM | POA: Insufficient documentation

## 2019-08-21 DIAGNOSIS — R928 Other abnormal and inconclusive findings on diagnostic imaging of breast: Secondary | ICD-10-CM | POA: Diagnosis not present

## 2019-08-26 ENCOUNTER — Ambulatory Visit: Payer: Medicare HMO

## 2019-08-27 ENCOUNTER — Ambulatory Visit: Payer: Medicare HMO | Attending: Internal Medicine

## 2019-08-27 DIAGNOSIS — Z23 Encounter for immunization: Secondary | ICD-10-CM

## 2019-08-27 NOTE — Progress Notes (Signed)
   Covid-19 Vaccination Clinic  Name:  Shannon [name]    MRN: [id number] DOB: 02/10/1951  08/27/2019  [name] was observed post Covid-19 immunization for 15 minutes without incident. She was provided with Vaccine Information Sheet and instruction to access the V-Safe system.   [name] was instructed to call 911 with any severe reactions post vaccine: Marland Kitchen Difficulty breathing  . Swelling of face and throat  . A fast heartbeat  . A bad rash all over body  . Dizziness and weakness   Immunizations Administered    Name Date Dose VIS Date Route   Pfizer COVID-19 Vaccine 08/27/2019  1:56 PM 0.3 mL 04/25/2019 Intramuscular   Manufacturer: [employer], [employer]   Lot: [id number]   [place]: [id number]

## 2019-09-30 DIAGNOSIS — Z96643 Presence of artificial hip joint, bilateral: Secondary | ICD-10-CM | POA: Diagnosis not present

## 2020-01-01 ENCOUNTER — Other Ambulatory Visit: Payer: Self-pay

## 2020-01-01 ENCOUNTER — Ambulatory Visit (INDEPENDENT_AMBULATORY_CARE_PROVIDER_SITE_OTHER): Payer: Medicare HMO | Admitting: Internal Medicine

## 2020-01-01 ENCOUNTER — Encounter: Payer: Self-pay | Admitting: Internal Medicine

## 2020-01-01 VITALS — BP 120/78 | HR 71 | Temp 97.8°F | Ht 64.02 in | Wt 183.0 lb

## 2020-01-01 DIAGNOSIS — F419 Anxiety disorder, unspecified: Secondary | ICD-10-CM

## 2020-01-01 DIAGNOSIS — E785 Hyperlipidemia, unspecified: Secondary | ICD-10-CM | POA: Diagnosis not present

## 2020-01-01 DIAGNOSIS — R928 Other abnormal and inconclusive findings on diagnostic imaging of breast: Secondary | ICD-10-CM

## 2020-01-01 DIAGNOSIS — Z658 Other specified problems related to psychosocial circumstances: Secondary | ICD-10-CM

## 2020-01-01 DIAGNOSIS — F339 Major depressive disorder, recurrent, unspecified: Secondary | ICD-10-CM | POA: Diagnosis not present

## 2020-01-01 DIAGNOSIS — E669 Obesity, unspecified: Secondary | ICD-10-CM

## 2020-01-01 DIAGNOSIS — M545 Low back pain, unspecified: Secondary | ICD-10-CM

## 2020-01-01 DIAGNOSIS — R69 Illness, unspecified: Secondary | ICD-10-CM | POA: Diagnosis not present

## 2020-01-01 DIAGNOSIS — F4321 Adjustment disorder with depressed mood: Secondary | ICD-10-CM

## 2020-01-01 DIAGNOSIS — Z1329 Encounter for screening for other suspected endocrine disorder: Secondary | ICD-10-CM | POA: Diagnosis not present

## 2020-01-01 DIAGNOSIS — R030 Elevated blood-pressure reading, without diagnosis of hypertension: Secondary | ICD-10-CM | POA: Diagnosis not present

## 2020-01-01 DIAGNOSIS — E538 Deficiency of other specified B group vitamins: Secondary | ICD-10-CM

## 2020-01-01 DIAGNOSIS — N3000 Acute cystitis without hematuria: Secondary | ICD-10-CM

## 2020-01-01 DIAGNOSIS — G8929 Other chronic pain: Secondary | ICD-10-CM

## 2020-01-01 MED ORDER — CITALOPRAM HYDROBROMIDE 10 MG PO TABS: 5.0000 mg | ORAL_TABLET | Freq: Every day | ORAL | 11 refills | Status: DC

## 2020-01-01 NOTE — Patient Instructions (Addendum)
[facility]  [place] [facility] ? Call insurance let me know who is in network  Dr. Bary Castilla [facility]) or Dr. Dorie Rank ([facility])-let me know who you want to see, [facility] Dr. Mirian Mo colorectal surgeon   Goal BP <130/<80   Lidocaine pain patches and stretching and heat for back   Duloxetine delayed-release capsules What is this medicine? DULOXETINE (doo LOX e teen) is used to treat depression, anxiety, and different types of chronic pain. This medicine may be used for other purposes; ask your health care provider or pharmacist if you have questions. COMMON BRAND NAME(S): Cymbalta, Creig Hines, Irenka What should I tell my health care provider before I take this medicine? They need to know if you have any of these conditions:  bipolar disorder  glaucoma  high blood pressure  kidney disease  liver disease  seizures  suicidal thoughts, plans or attempt; a previous suicide attempt by you or a family member  take medicines that treat or prevent blood clots  taken medicines called MAOIs like Carbex, Eldepryl, Marplan, Nardil, and Parnate within 14 days  trouble passing urine  an unusual reaction to duloxetine, other medicines, foods, dyes, or preservatives  pregnant or trying to get pregnant  breast-feeding How should I use this medicine? Take this medicine by mouth with a glass of water. Follow the directions on the prescription label. Do not crush, cut or chew some capsules of this medicine. Some capsules may be opened and sprinkled on applesauce. Check with your doctor or pharmacist if you are not sure. You can take this medicine with or without food. Take your medicine at regular intervals. Do not take your medicine more often than directed. Do not stop taking this medicine suddenly except upon the advice of your doctor. Stopping this medicine too quickly may cause serious side effects or your condition may worsen. A special MedGuide will be given to you by the  pharmacist with each prescription and refill. Be sure to read this information carefully each time. Talk to your pediatrician regarding the use of this medicine in children. While this drug may be prescribed for children as young as 40 years of age for selected conditions, precautions do apply. Overdosage: If you think you have taken too much of this medicine contact a poison control center or emergency room at once. NOTE: This medicine is only for you. Do not share this medicine with others. What if I miss a dose? If you miss a dose, take it as soon as you can. If it is almost time for your next dose, take only that dose. Do not take double or extra doses. What may interact with this medicine? Do not take this medicine with any of the following medications:  desvenlafaxine  levomilnacipran  linezolid  MAOIs like Carbex, Eldepryl, Marplan, Nardil, and Parnate  methylene blue (injected into a vein)  milnacipran  thioridazine  venlafaxine This medicine may also interact with the following medications:  alcohol  amphetamines  aspirin and aspirin-like medicines  certain antibiotics like ciprofloxacin and enoxacin  certain medicines for blood pressure, heart disease, irregular heart beat  certain medicines for depression, anxiety, or psychotic disturbances  certain medicines for migraine headache like almotriptan, eletriptan, frovatriptan, naratriptan, rizatriptan, sumatriptan, zolmitriptan  certain medicines that treat or prevent blood clots like warfarin, enoxaparin, and dalteparin  cimetidine  fentanyl  lithium  NSAIDS, medicines for pain and inflammation, like ibuprofen or naproxen  phentermine  procarbazine  rasagiline  sibutramine  St. John's wort  theophylline  tramadol  tryptophan This list may not describe all possible interactions. Give your health care provider a list of all the medicines, herbs, non-prescription drugs, or dietary supplements you  use. Also tell them if you smoke, drink alcohol, or use illegal drugs. Some items may interact with your medicine. What should I watch for while using this medicine? Tell your doctor if your symptoms do not get better or if they get worse. Visit your doctor or healthcare provider for regular checks on your progress. Because it may take several weeks to see the full effects of this medicine, it is important to continue your treatment as prescribed by your doctor. This medicine may cause serious skin reactions. They can happen weeks to months after starting the medicine. Contact your healthcare provider right away if you notice fevers or flu-like symptoms with a rash. The rash may be red or purple and then turn into blisters or peeling of the skin. Or, you might notice a red rash with swelling of the face, lips, or lymph nodes in your neck or under your arms. Patients and their families should watch out for new or worsening thoughts of suicide or depression. Also watch out for sudden changes in feelings such as feeling anxious, agitated, panicky, irritable, hostile, aggressive, impulsive, severely restless, overly excited and hyperactive, or not being able to sleep. If this happens, especially at the beginning of treatment or after a change in dose, call your healthcare provider. You may get drowsy or dizzy. Do not drive, use machinery, or do anything that needs mental alertness until you know how this medicine affects you. Do not stand or sit up quickly, especially if you are an older patient. This reduces the risk of dizzy or fainting spells. Alcohol may interfere with the effect of this medicine. Avoid alcoholic drinks. This medicine can cause an increase in blood pressure. This medicine can also cause a sudden drop in your blood pressure, which may make you feel faint and increase the chance of a fall. These effects are most common when you first start the medicine or when the dose is increased, or during  use of other medicines that can cause a sudden drop in blood pressure. Check with your doctor for instructions on monitoring your blood pressure while taking this medicine. Your mouth may get dry. Chewing sugarless gum or sucking hard candy, and drinking plenty of water, may help. Contact your doctor if the problem does not go away or is severe. What side effects may I notice from receiving this medicine? Side effects that you should report to your doctor or health care professional as soon as possible:  allergic reactions like skin rash, itching or hives, swelling of the face, lips, or tongue  anxious  breathing problems  confusion  changes in vision  chest pain  confusion  elevated mood, decreased need for sleep, racing thoughts, impulsive behavior  eye pain  fast, irregular heartbeat  feeling faint or lightheaded, falls  feeling agitated, angry, or irritable  hallucination, loss of contact with reality  high blood pressure  loss of balance or coordination  palpitations  redness, blistering, peeling or loosening of the skin, including inside the mouth  restlessness, pacing, inability to keep still  seizures  stiff muscles  suicidal thoughts or other mood changes  trouble passing urine or change in the amount of urine  trouble sleeping  unusual bleeding or bruising  unusually weak or tired  vomiting  yellowing of the eyes or skin Side effects that usually  do not require medical attention (report to your doctor or health care professional if they continue or are bothersome):  change in sex drive or performance  change in appetite or weight  constipation  dizziness  dry mouth  headache  increased sweating  nausea  tired This list may not describe all possible side effects. Call your doctor for medical advice about side effects. You may report side effects to FDA at 1-800-FDA-1088. Where should I keep my medicine? Keep out of the reach of  children. Store at room temperature between 15 and 30 degrees C (59 to 86 degrees F). Throw away any unused medicine after the expiration date. NOTE: This sheet is a summary. It may not cover all possible information. If you have questions about this medicine, talk to your doctor, pharmacist, or health care provider.  2020 Elsevier/Gold Standard (2018-08-01 13:47:50)   Citalopram tablets What is this medicine? CITALOPRAM (sye TAL oh pram) is a medicine for depression. This medicine may be used for other purposes; ask your health care provider or pharmacist if you have questions. COMMON BRAND NAME(S): Celexa What should I tell my health care provider before I take this medicine? They need to know if you have any of these conditions:  bleeding disorders  bipolar disorder or a family history of bipolar disorder  glaucoma  heart disease  history of irregular heartbeat  kidney disease  liver disease  low levels of magnesium or potassium in the blood  receiving electroconvulsive therapy  seizures  suicidal thoughts, plans, or attempt; a previous suicide attempt by you or a family member  take medicines that treat or prevent blood clots  thyroid disease  an unusual or allergic reaction to citalopram, escitalopram, other medicines, foods, dyes, or preservatives  pregnant or trying to become pregnant  breast-feeding How should I use this medicine? Take this medicine by mouth with a glass of water. Follow the directions on the prescription label. You can take it with or without food. Take your medicine at regular intervals. Do not take your medicine more often than directed. Do not stop taking this medicine suddenly except upon the advice of your doctor. Stopping this medicine too quickly may cause serious side effects or your condition may worsen. A special MedGuide will be given to you by the pharmacist with each prescription and refill. Be sure to read this information  carefully each time. Talk to your pediatrician regarding the use of this medicine in children. Special care may be needed. Patients over 87 years old may have a stronger reaction and need a smaller dose. Overdosage: If you think you have taken too much of this medicine contact a poison control center or emergency room at once. NOTE: This medicine is only for you. Do not share this medicine with others. What if I miss a dose? If you miss a dose, take it as soon as you can. If it is almost time for your next dose, take only that dose. Do not take double or extra doses. What may interact with this medicine? Do not take this medicine with any of the following medications:  certain medicines for fungal infections like fluconazole, itraconazole, ketoconazole, posaconazole, voriconazole  cisapride  dronedarone  escitalopram  linezolid  MAOIs like Carbex, Eldepryl, Marplan, Nardil, and Parnate  methylene blue (injected into a vein)  pimozide  thioridazine This medicine may also interact with the following medications:  alcohol  amphetamines  aspirin and aspirin-like medicines  carbamazepine  certain medicines for depression, anxiety,  or psychotic disturbances  certain medicines for infections like chloroquine, clarithromycin, erythromycin, furazolidone, isoniazid, pentamidine  certain medicines for migraine headaches like almotriptan, eletriptan, frovatriptan, naratriptan, rizatriptan, sumatriptan, zolmitriptan  certain medicines for sleep  certain medicines that treat or prevent blood clots like dalteparin, enoxaparin, warfarin  cimetidine  diuretics  dofetilide  fentanyl  lithium  methadone  metoprolol  NSAIDs, medicines for pain and inflammation, like ibuprofen or naproxen  omeprazole  other medicines that prolong the QT interval (cause an abnormal heart rhythm)  procarbazine  rasagiline  supplements like St. John's wort, kava kava,  valerian  tramadol  tryptophan  ziprasidone This list may not describe all possible interactions. Give your health care provider a list of all the medicines, herbs, non-prescription drugs, or dietary supplements you use. Also tell them if you smoke, drink alcohol, or use illegal drugs. Some items may interact with your medicine. What should I watch for while using this medicine? Tell your doctor if your symptoms do not get better or if they get worse. Visit your doctor or health care professional for regular checks on your progress. Because it may take several weeks to see the full effects of this medicine, it is important to continue your treatment as prescribed by your doctor. Patients and their families should watch out for new or worsening thoughts of suicide or depression. Also watch out for sudden changes in feelings such as feeling anxious, agitated, panicky, irritable, hostile, aggressive, impulsive, severely restless, overly excited and hyperactive, or not being able to sleep. If this happens, especially at the beginning of treatment or after a change in dose, call your health care professional. Dennis Bast may get drowsy or dizzy. Do not drive, use machinery, or do anything that needs mental alertness until you know how this medicine affects you. Do not stand or sit up quickly, especially if you are an older patient. This reduces the risk of dizzy or fainting spells. Alcohol may interfere with the effect of this medicine. Avoid alcoholic drinks. Your mouth may get dry. Chewing sugarless gum or sucking hard candy, and drinking plenty of water will help. Contact your doctor if the problem does not go away or is severe. What side effects may I notice from receiving this medicine? Side effects that you should report to your doctor or health care professional as soon as possible:  allergic reactions like skin rash, itching or hives, swelling of the face, lips, or tongue  anxious  black, tarry  stools  breathing problems  changes in vision  chest pain  confusion  elevated mood, decreased need for sleep, racing thoughts, impulsive behavior  eye pain  fast, irregular heartbeat  feeling faint or lightheaded, falls  feeling agitated, angry, or irritable  hallucination, loss of contact with reality  loss of balance or coordination  loss of memory  painful or prolonged erections  restlessness, pacing, inability to keep still  seizures  stiff muscles  suicidal thoughts or other mood changes  trouble sleeping  unusual bleeding or bruising  unusually weak or tired  vomiting Side effects that usually do not require medical attention (report to your doctor or health care professional if they continue or are bothersome):  change in appetite or weight  change in sex drive or performance  dizziness  headache  increased sweating  indigestion, nausea  tremors This list may not describe all possible side effects. Call your doctor for medical advice about side effects. You may report side effects to FDA at 1-800-FDA-1088. Where should  I keep my medicine? Keep out of reach of children. Store at room temperature between 15 and 30 degrees C (59 and 86 degrees F). Throw away any unused medicine after the expiration date. NOTE: This sheet is a summary. It may not cover all possible information. If you have questions about this medicine, talk to your doctor, pharmacist, or health care provider.  2020 Elsevier/Gold Standard (2018-04-22 09:05:36)     Low Back Sprain or Strain Rehab Ask your health care provider which exercises are safe for you. Do exercises exactly as told by your health care provider and adjust them as directed. It is normal to feel mild stretching, pulling, tightness, or discomfort as you do these exercises. Stop right away if you feel sudden pain or your pain gets worse. Do not begin these exercises until told by your health care  provider. Stretching and range-of-motion exercises These exercises warm up your muscles and joints and improve the movement and flexibility of your back. These exercises also help to relieve pain, numbness, and tingling. Lumbar rotation  1. Lie on your back on a firm surface and bend your knees. 2. Straighten your arms out to your sides so each arm forms a 90-degree angle (right angle) with a side of your body. 3. Slowly move (rotate) both of your knees to one side of your body until you feel a stretch in your lower back (lumbar). Try not to let your shoulders lift off the floor. 4. Hold this position for __________ seconds. 5. Tense your abdominal muscles and slowly move your knees back to the starting position. 6. Repeat this exercise on the other side of your body. Repeat __________ times. Complete this exercise __________ times a day. Single knee to chest  1. Lie on your back on a firm surface with both legs straight. 2. Bend one of your knees. Use your hands to move your knee up toward your chest until you feel a gentle stretch in your lower back and buttock. ? Hold your leg in this position by holding on to the front of your knee. ? Keep your other leg as straight as possible. 3. Hold this position for __________ seconds. 4. Slowly return to the starting position. 5. Repeat with your other leg. Repeat __________ times. Complete this exercise __________ times a day. Prone extension on elbows  1. Lie on your abdomen on a firm surface (prone position). 2. Prop yourself up on your elbows. 3. Use your arms to help lift your chest up until you feel a gentle stretch in your abdomen and your lower back. ? This will place some of your body weight on your elbows. If this is uncomfortable, try stacking pillows under your chest. ? Your hips should stay down, against the surface that you are lying on. Keep your hip and back muscles relaxed. 4. Hold this position for __________  seconds. 5. Slowly relax your upper body and return to the starting position. Repeat __________ times. Complete this exercise __________ times a day. Strengthening exercises These exercises build strength and endurance in your back. Endurance is the ability to use your muscles for a long time, even after they get tired. Pelvic tilt This exercise strengthens the muscles that lie deep in the abdomen. 1. Lie on your back on a firm surface. Bend your knees and keep your feet flat on the floor. 2. Tense your abdominal muscles. Tip your pelvis up toward the ceiling and flatten your lower back into the floor. ? To help with  this exercise, you may place a small towel under your lower back and try to push your back into the towel. 3. Hold this position for __________ seconds. 4. Let your muscles relax completely before you repeat this exercise. Repeat __________ times. Complete this exercise __________ times a day. Alternating arm and leg raises  1. Get on your hands and knees on a firm surface. If you are on a hard floor, you may want to use padding, such as an exercise mat, to cushion your knees. 2. Line up your arms and legs. Your hands should be directly below your shoulders, and your knees should be directly below your hips. 3. Lift your left leg behind you. At the same time, raise your right arm and straighten it in front of you. ? Do not lift your leg higher than your hip. ? Do not lift your arm higher than your shoulder. ? Keep your abdominal and back muscles tight. ? Keep your hips facing the ground. ? Do not arch your back. ? Keep your balance carefully, and do not hold your breath. 4. Hold this position for __________ seconds. 5. Slowly return to the starting position. 6. Repeat with your right leg and your left arm. Repeat __________ times. Complete this exercise __________ times a day. Abdominal set with straight leg raise  1. Lie on your back on a firm surface. 2. Bend one of your  knees and keep your other leg straight. 3. Tense your abdominal muscles and lift your straight leg up, 4-6 inches (10-15 cm) off the ground. 4. Keep your abdominal muscles tight and hold this position for __________ seconds. ? Do not hold your breath. ? Do not arch your back. Keep it flat against the ground. 5. Keep your abdominal muscles tense as you slowly lower your leg back to the starting position. 6. Repeat with your other leg. Repeat __________ times. Complete this exercise __________ times a day. Single leg lower with bent knees 1. Lie on your back on a firm surface. 2. Tense your abdominal muscles and lift your feet off the floor, one foot at a time, so your knees and hips are bent in 90-degree angles (right angles). ? Your knees should be over your hips and your lower legs should be parallel to the floor. 3. Keeping your abdominal muscles tense and your knee bent, slowly lower one of your legs so your toe touches the ground. 4. Lift your leg back up to return to the starting position. ? Do not hold your breath. ? Do not let your back arch. Keep your back flat against the ground. 5. Repeat with your other leg. Repeat __________ times. Complete this exercise __________ times a day. Posture and body mechanics Good posture and healthy body mechanics can help to relieve stress in your body's tissues and joints. Body mechanics refers to the movements and positions of your body while you do your daily activities. Posture is part of body mechanics. Good posture means:  Your spine is in its natural S-curve position (neutral).  Your shoulders are pulled back slightly.  Your head is not tipped forward. Follow these guidelines to improve your posture and body mechanics in your everyday activities. Standing   When standing, keep your spine neutral and your feet about hip width apart. Keep a slight bend in your knees. Your ears, shoulders, and hips should line up.  When you do a task in  which you stand in one place for a long time, place one foot  up on a stable object that is 2-4 inches (5-10 cm) high, such as a footstool. This helps keep your spine neutral. Sitting   When sitting, keep your spine neutral and keep your feet flat on the floor. Use a footrest, if necessary, and keep your thighs parallel to the floor. Avoid rounding your shoulders, and avoid tilting your head forward.  When working at a desk or a computer, keep your desk at a height where your hands are slightly lower than your elbows. Slide your chair under your desk so you are close enough to maintain good posture.  When working at a computer, place your monitor at a height where you are looking straight ahead and you do not have to tilt your head forward or downward to look at the screen. Resting  When lying down and resting, avoid positions that are most painful for you.  If you have pain with activities such as sitting, bending, stooping, or squatting, lie in a position in which your body does not bend very much. For example, avoid curling up on your side with your arms and knees near your chest (fetal position).  If you have pain with activities such as standing for a long time or reaching with your arms, lie with your spine in a neutral position and bend your knees slightly. Try the following positions: ? Lying on your side with a pillow between your knees. ? Lying on your back with a pillow under your knees. Lifting   When lifting objects, keep your feet at least shoulder width apart and tighten your abdominal muscles.  Bend your knees and hips and keep your spine neutral. It is important to lift using the strength of your legs, not your back. Do not lock your knees straight out.  Always ask for help to lift heavy or awkward objects. This information is not intended to replace advice given to you by your health care provider. Make sure you discuss any questions you have with your health care  provider. Document Revised: 08/23/2018 Document Reviewed: 05/23/2018 Elsevier Patient Education  [place].  Back Exercises The following exercises strengthen the muscles that help to support the trunk and back. They also help to keep the lower back flexible. Doing these exercises can help to prevent back pain or lessen existing pain.  If you have back pain or discomfort, try doing these exercises 2-3 times each day or as told by your health care provider.  As your pain improves, do them once each day, but increase the number of times that you repeat the steps for each exercise (do more repetitions).  To prevent the recurrence of back pain, continue to do these exercises once each day or as told by your health care provider. Do exercises exactly as told by your health care provider and adjust them as directed. It is normal to feel mild stretching, pulling, tightness, or discomfort as you do these exercises, but you should stop right away if you feel sudden pain or your pain gets worse. Exercises Single knee to chest Repeat these steps 3-5 times for each leg: 1. Lie on your back on a firm bed or the floor with your legs extended. 2. Bring one knee to your chest. Your other leg should stay extended and in contact with the floor. 3. Hold your knee in place by grabbing your knee or thigh with both hands and hold. 4. Pull on your knee until you feel a gentle stretch in your lower back  or buttocks. 5. Hold the stretch for 10-30 seconds. 6. Slowly release and straighten your leg. Pelvic tilt Repeat these steps 5-10 times: 1. Lie on your back on a firm bed or the floor with your legs extended. 2. Bend your knees so they are pointing toward the ceiling and your feet are flat on the floor. 3. Tighten your lower abdominal muscles to press your lower back against the floor. This motion will tilt your pelvis so your tailbone points up toward the ceiling instead of pointing to your feet or the  floor. 4. With gentle tension and even breathing, hold this position for 5-10 seconds. Cat-cow Repeat these steps until your lower back becomes more flexible: 1. Get into a hands-and-knees position on a firm surface. Keep your hands under your shoulders, and keep your knees under your hips. You may place padding under your knees for comfort. 2. Let your head hang down toward your chest. Contract your abdominal muscles and point your tailbone toward the floor so your lower back becomes rounded like the back of a cat. 3. Hold this position for 5 seconds. 4. Slowly lift your head, let your abdominal muscles relax and point your tailbone up toward the ceiling so your back forms a sagging arch like the back of a cow. 5. Hold this position for 5 seconds.  Press-ups Repeat these steps 5-10 times: 1. Lie on your abdomen (face-down) on the floor. 2. Place your palms near your head, about shoulder-width apart. 3. Keeping your back as relaxed as possible and keeping your hips on the floor, slowly straighten your arms to raise the top half of your body and lift your shoulders. Do not use your back muscles to raise your upper torso. You may adjust the placement of your hands to make yourself more comfortable. 4. Hold this position for 5 seconds while you keep your back relaxed. 5. Slowly return to lying flat on the floor.  Bridges Repeat these steps 10 times: 1. Lie on your back on a firm surface. 2. Bend your knees so they are pointing toward the ceiling and your feet are flat on the floor. Your arms should be flat at your sides, next to your body. 3. Tighten your buttocks muscles and lift your buttocks off the floor until your waist is at almost the same height as your knees. You should feel the muscles working in your buttocks and the back of your thighs. If you do not feel these muscles, slide your feet 1-2 inches farther away from your buttocks. 4. Hold this position for 3-5 seconds. 5. Slowly lower  your hips to the starting position, and allow your buttocks muscles to relax completely. If this exercise is too easy, try doing it with your arms crossed over your chest. Abdominal crunches Repeat these steps 5-10 times: 1. Lie on your back on a firm bed or the floor with your legs extended. 2. Bend your knees so they are pointing toward the ceiling and your feet are flat on the floor. 3. Cross your arms over your chest. 4. Tip your chin slightly toward your chest without bending your neck. 5. Tighten your abdominal muscles and slowly raise your trunk (torso) high enough to lift your shoulder blades a tiny bit off the floor. Avoid raising your torso higher than that because it can put too much stress on your low back and does not help to strengthen your abdominal muscles. 6. Slowly return to your starting position. Back lifts Repeat these steps  5-10 times: 1. Lie on your abdomen (face-down) with your arms at your sides, and rest your forehead on the floor. 2. Tighten the muscles in your legs and your buttocks. 3. Slowly lift your chest off the floor while you keep your hips pressed to the floor. Keep the back of your head in line with the curve in your back. Your eyes should be looking at the floor. 4. Hold this position for 3-5 seconds. 5. Slowly return to your starting position. Contact a health care provider if:  Your back pain or discomfort gets much worse when you do an exercise.  Your worsening back pain or discomfort does not lessen within 2 hours after you exercise. If you have any of these problems, stop doing these exercises right away. Do not do them again unless your health care provider says that you can. Get help right away if:  You develop sudden, severe back pain. If this happens, stop doing the exercises right away. Do not do them again unless your health care provider says that you can. This information is not intended to replace advice given to you by your health care  provider. Make sure you discuss any questions you have with your health care provider. Document Revised: 09/05/2018 Document Reviewed: 01/31/2018 Elsevier Patient Education  [place].

## 2020-01-01 NOTE — Progress Notes (Addendum)
Chief Complaint  Patient presents with  . Follow-up   F/u  1. Chronic low back pain declines steroid shot to back pt did help in the past  2. Elevated BP 138/100 repeat 120/78 no meds for BP advised pt to monitor  3. Recurrent depression/anxiety, grief, Stressors sister died the end of 2018-08-23 and son with cognitive/mood issues living independently and she has to go check on him and caring for long term boyfriend as well who does not drive and her sisters daughter (her niece's) dad just died as well on the same day her niece had a baby these are triggers for her stress ans she wants to see therapy  PHQ score 10 and GAD 7 score 10 today  She has been on meds in the past but paxil made her sweat, zoloft and wellbutrin caused itching  Her son above is on celexa and she wants to try a low dose of this with therapy  4. Due b/l diag mammo in 02/20/20    Review of Systems  Constitutional: Negative for weight loss.  HENT: Negative for hearing loss.   Eyes: Negative for blurred vision.  Respiratory: Negative for shortness of breath.   Cardiovascular: Negative for chest pain.  Gastrointestinal: Negative for abdominal pain.  Musculoskeletal: Positive for back pain. Negative for falls.  Skin: Negative for rash.  Neurological: Negative for headaches.  Psychiatric/Behavioral: Positive for depression. The patient is nervous/anxious.        +grief and stressors    Past Medical History:  Diagnosis Date  . Allergic rhinitis   . Anxiety   . Arthritis    Osteoarthritis mild DDD lumbar spine noted in August 22, 2016   . Headache   . Stress   . Trochanteric bursitis    Past Surgical History:  Procedure Laterality Date  . CATARACT EXTRACTION     b/l 10 and 04/2018  . CERVICAL CERCLAGE      X 3  . JOINT REPLACEMENT    . TOTAL HIP ARTHROPLASTY Right 08/24/2014   Dr. Marry Guan, [facility]  . TOTAL HIP ARTHROPLASTY Left 09/25/2016   Procedure: TOTAL HIP ARTHROPLASTY;  Surgeon: Dereck Leep, MD;  Location: [facility] ORS;   Service: Orthopedics;  Laterality: Left;   Family History  Problem Relation Age of Onset  . Osteoporosis Mother   . Arthritis/Rheumatoid Mother   . Hyperlipidemia Mother   . Hypertension Father   . Lung cancer Father        deid 49  . Other Father        brain tumor  . Hyperlipidemia Father   . CAD Father        s/p cabg  . Other Sister        brain tumor glioblastoma died as of Jun 01, 2019  . Gout Son   . Other Son        cognitive d/o   Social History   Socioeconomic History  . Marital status: Divorced    Spouse name: Not on file  . Number of children: Not on file  . Years of education: Not on file  . Highest education level: Not on file  Occupational History  . Not on file  Tobacco Use  . Smoking status: Former Smoker    Packs/day: 1.00    Types: Cigarettes    Quit date: 08/13/2014    Years since quitting: 5.4  . Smokeless tobacco: Never Used  Vaping Use  . Vaping Use: Former  Substance and Sexual Activity  . Alcohol use: No  .  Drug use: No  . Sexual activity: Not on file  Other Topics Concern  . 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 Difficulty of Paying Living Expenses: Not on file  Food Insecurity:   . Worried About Charity fundraiser in the Last Year: Not on file  . Ran Out of Food in the Last Year: Not on file  Transportation Needs:   . Lack of Transportation (Medical): Not on file  . Lack of Transportation (Non-Medical): Not on file  Physical Activity:   . Days of Exercise per Week: Not on file  . Minutes of Exercise per Session: Not on file  Stress:   . Feeling of Stress : Not on file  Social Connections:   . Frequency of Communication with Friends and Family: Not on file  . Frequency of  Social Gatherings with Friends and Family: Not on file  . Attends Religious Services: Not on file  . Active Member of Clubs or Organizations: Not on file  . Attends Archivist Meetings: Not on file  . Marital Status: Not on file  Intimate Partner Violence:   . Fear of Current or Ex-Partner: Not on file  . Emotionally Abused: Not on file  . Physically Abused: Not on file  . Sexually Abused: Not on file   Current Meds  Medication Sig  . acetaminophen (TYLENOL) 500 MG tablet Take 500 mg by mouth every 6 (six) hours as needed for mild pain.  . Artificial Tear Solution (SOOTHE XP) SOLN Apply 1-2 drops to eye as needed (dry eye).  . Calcium-Vitamins C & D (CALCIUM/C/D PO) Take by mouth.  . Cholecalciferol (VITAMIN D3) 2000 units capsule Take 2,000 Units by mouth daily.  . fexofenadine (ALLEGRA) 180 MG tablet Take 180 mg by mouth daily as needed for allergies or rhinitis.  Marland Kitchen Ketotifen Fumarate (EYE ITCH RELIEF OP) Place 1 drop into both eyes daily.  . Multiple Vitamin (MULTIVITAMIN ADULT PO) Take by mouth.   Allergies  Allergen Reactions  . Paxil [Paroxetine]     Sweating increased    . Wellbutrin [Bupropion] Itching  . Zoloft [Sertraline Hcl] Itching   No results found for this or any previous visit (from the past 2160 hour(s)). Objective  Body mass index is 31.4 kg/m. Wt Readings from Last 3 Encounters:  01/01/20 183 lb (83 kg)  07/03/19 180 lb 12.8 oz (82 kg)  01/07/19 180 lb 12.8 oz (82 kg)   Temp Readings from Last 3 Encounters:  01/01/20 97.8 F (36.6 C)  01/07/19 98.3 F (36.8 C) (Oral)  12/05/18 99.1 F (37.3 C) (Oral)   BP Readings from Last 3 Encounters:  01/01/20 120/78  07/03/19 119/70  01/07/19 124/72   Pulse Readings from Last 3 Encounters:  01/01/20 71  07/03/19 66  01/07/19 76    Physical Exam Vitals and nursing note reviewed.  Constitutional:      Appearance: Normal appearance. She is well-developed and well-groomed. She is obese.   HENT:     Head: Normocephalic and atraumatic.  Eyes:     Conjunctiva/sclera: Conjunctivae normal.     Pupils: Pupils  are equal, round, and reactive to light.  Cardiovascular:     Rate and Rhythm: Normal rate and regular rhythm.     Heart sounds: Normal heart sounds. No murmur heard.   Pulmonary:     Effort: Pulmonary effort is normal.     Breath sounds: Normal breath sounds.  Skin:    General: Skin is warm and moist.  Neurological:     General: No focal deficit present.     Mental Status: She is alert and oriented to person, place, and time. Mental status is at baseline.     Gait: Gait normal.  Psychiatric:        Attention and Perception: Attention and perception normal.        Mood and Affect: Mood and affect normal.        Speech: Speech normal.        Behavior: Behavior normal. Behavior is cooperative.        Thought Content: Thought content normal.        Cognition and Memory: Cognition and memory normal.        Judgment: Judgment normal.     Assessment  Plan  Depression, recurrent ([place]) - Plan: citalopram (CELEXA) 10 MG tablet Anxiety - Plan: citalopram (CELEXA) 10 MG tablet -[facility] for therapy vs [facility] let me know who is in network will refer leb [place] for now  -went ahead and referred leb [place] for therapy  Also could consider cymbalta due to chronic back pain in the future   Chronic low back pain, unspecified back pain laterality, unspecified whether sciatica present Given exercises to do and lidocaine pain patches Declines epidural injections  PT did help in the past   Elevated blood pressure reading/HLD Repeat improved monitor  Fasting labs  Obesity (BMI 30.0-34.9)  rec healthy diet and exercise   HM Flu shot 01/07/19  discf/u with prevnar and shingrix  utd Tdap and pna 23  2/2 pfizer covid 19 vaccine shingrix given Rx   02/11/19 had mammogram due repeat dx right mammo 6 month f/u had 08/21/19 which rec b/l diag mammo for stability order in pt to  call to schedule   DEXA in 2015 DEXA osteopenia DEXA 01/31/19 osteopenia   Colonoscopy 11/20/13 hyperplastic polyps and anal lesion -pt considering removal will need referral in future if interestedshe declines referral today 01/08/19 and 01/01/20 disc Dr. Bary Castilla or Dr. Dorie Rank or [facility] Dr. Mirian Mo   Pap 10/17/13 negative   hcv neg 09/03/11  Former smoker quit in 2016 smoked x 20 years max 1-2 ppd FH father lung cancer  Skin-no issues as of 8/26/20not assessed 01/01/20   Ortho Dr. Elmon Else  Provider: Dr. Olivia Mackie [facility]

## 2020-01-05 ENCOUNTER — Encounter: Payer: Self-pay | Admitting: Internal Medicine

## 2020-01-05 DIAGNOSIS — M545 Low back pain, unspecified: Secondary | ICD-10-CM | POA: Insufficient documentation

## 2020-01-05 DIAGNOSIS — F339 Major depressive disorder, recurrent, unspecified: Secondary | ICD-10-CM | POA: Insufficient documentation

## 2020-01-05 DIAGNOSIS — Z658 Other specified problems related to psychosocial circumstances: Secondary | ICD-10-CM | POA: Insufficient documentation

## 2020-01-05 DIAGNOSIS — G8929 Other chronic pain: Secondary | ICD-10-CM | POA: Insufficient documentation

## 2020-01-05 DIAGNOSIS — E669 Obesity, unspecified: Secondary | ICD-10-CM | POA: Insufficient documentation

## 2020-01-05 DIAGNOSIS — E66811 Obesity, class 1: Secondary | ICD-10-CM | POA: Insufficient documentation

## 2020-01-21 ENCOUNTER — Other Ambulatory Visit: Payer: Self-pay

## 2020-01-21 ENCOUNTER — Other Ambulatory Visit (INDEPENDENT_AMBULATORY_CARE_PROVIDER_SITE_OTHER): Payer: Medicare HMO

## 2020-01-21 DIAGNOSIS — F419 Anxiety disorder, unspecified: Secondary | ICD-10-CM

## 2020-01-21 DIAGNOSIS — Z1329 Encounter for screening for other suspected endocrine disorder: Secondary | ICD-10-CM

## 2020-01-21 DIAGNOSIS — E785 Hyperlipidemia, unspecified: Secondary | ICD-10-CM | POA: Diagnosis not present

## 2020-01-21 DIAGNOSIS — N3000 Acute cystitis without hematuria: Secondary | ICD-10-CM | POA: Diagnosis not present

## 2020-01-21 DIAGNOSIS — E538 Deficiency of other specified B group vitamins: Secondary | ICD-10-CM | POA: Diagnosis not present

## 2020-01-21 DIAGNOSIS — R030 Elevated blood-pressure reading, without diagnosis of hypertension: Secondary | ICD-10-CM | POA: Diagnosis not present

## 2020-01-21 DIAGNOSIS — R69 Illness, unspecified: Secondary | ICD-10-CM | POA: Diagnosis not present

## 2020-01-21 LAB — URINALYSIS, ROUTINE W REFLEX MICROSCOPIC
Bilirubin Urine: NEGATIVE
Hgb urine dipstick: NEGATIVE
Ketones, ur: NEGATIVE
Leukocytes,Ua: NEGATIVE
Nitrite: NEGATIVE
RBC / HPF: NONE SEEN (ref 0–?)
Specific Gravity, Urine: <=1.005 — AB (ref 1.000–1.030)
Total Protein, Urine: NEGATIVE
Urine Glucose: NEGATIVE
Urobilinogen, UA: 0.2 (ref 0.0–1.0)
pH: 5.5 (ref 5.0–8.0)

## 2020-01-21 LAB — CBC WITH DIFFERENTIAL/PLATELET
Basophils Absolute: 0.1 10*3/uL (ref 0.0–0.1)
Basophils Relative: 1.4 % (ref 0.0–3.0)
Eosinophils Absolute: 0.2 10*3/uL (ref 0.0–0.7)
Eosinophils Relative: 2.9 % (ref 0.0–5.0)
HCT: 40.8 % (ref 36.0–46.0)
Hemoglobin: 13.6 g/dL (ref 12.0–15.0)
Lymphocytes Relative: 33.6 % (ref 12.0–46.0)
Lymphs Abs: 2.6 10*3/uL (ref 0.7–4.0)
MCHC: 33.4 g/dL (ref 30.0–36.0)
MCV: 85.7 fl (ref 78.0–100.0)
Monocytes Absolute: 0.5 10*3/uL (ref 0.1–1.0)
Monocytes Relative: 6.9 % (ref 3.0–12.0)
Neutro Abs: 4.2 10*3/uL (ref 1.4–7.7)
Neutrophils Relative %: 55.2 % (ref 43.0–77.0)
Platelets: 275 10*3/uL (ref 150.0–400.0)
RBC: 4.77 Mil/uL (ref 3.87–5.11)
RDW: 14.8 % (ref 11.5–15.5)
WBC: 7.7 10*3/uL (ref 4.0–10.5)

## 2020-01-21 LAB — LIPID PANEL
Cholesterol: 256 mg/dL — ABNORMAL HIGH (ref 0–200)
HDL: 64.2 mg/dL (ref >39.00)
LDL Cholesterol: 168 mg/dL — ABNORMAL HIGH (ref 0–99)
NonHDL: 191.97
Total CHOL/HDL Ratio: 4
Triglycerides: 118 mg/dL (ref 0.0–149.0)
VLDL: 23.6 mg/dL (ref 0.0–40.0)

## 2020-01-21 LAB — COMPREHENSIVE METABOLIC PANEL
ALT: 26 U/L (ref 0–35)
AST: 23 U/L (ref 0–37)
Albumin: 4.3 g/dL (ref 3.5–5.2)
Alkaline Phosphatase: 101 U/L (ref 39–117)
BUN: 15 mg/dL (ref 6–23)
CO2: 30 mEq/L (ref 19–32)
Calcium: 9.8 mg/dL (ref 8.4–10.5)
Chloride: 103 mEq/L (ref 96–112)
Creatinine, Ser: 0.94 mg/dL (ref 0.40–1.20)
GFR: 59.3 mL/min — ABNORMAL LOW (ref >60.00)
Glucose, Bld: 91 mg/dL (ref 70–99)
Potassium: 4.6 mEq/L (ref 3.5–5.1)
Sodium: 138 mEq/L (ref 135–145)
Total Bilirubin: 0.6 mg/dL (ref 0.2–1.2)
Total Protein: 6.8 g/dL (ref 6.0–8.3)

## 2020-01-21 LAB — TSH: TSH: 0.76 u[IU]/mL (ref 0.35–4.50)

## 2020-01-21 LAB — VITAMIN B12: Vitamin B-12: 173 pg/mL — ABNORMAL LOW (ref 211–911)

## 2020-01-22 LAB — URINE CULTURE
MICRO NUMBER:: 10923982
SPECIMEN QUALITY:: ADEQUATE

## 2020-01-25 ENCOUNTER — Encounter: Payer: Self-pay | Admitting: Internal Medicine

## 2020-01-25 DIAGNOSIS — E538 Deficiency of other specified B group vitamins: Secondary | ICD-10-CM | POA: Insufficient documentation

## 2020-01-28 ENCOUNTER — Telehealth: Payer: Self-pay | Admitting: Internal Medicine

## 2020-01-28 NOTE — Telephone Encounter (Signed)
Patient informed, see result notes

## 2020-01-28 NOTE — Telephone Encounter (Signed)
Pt returned call regarding results 

## 2020-02-09 ENCOUNTER — Other Ambulatory Visit: Payer: Self-pay | Admitting: Internal Medicine

## 2020-02-09 DIAGNOSIS — R928 Other abnormal and inconclusive findings on diagnostic imaging of breast: Secondary | ICD-10-CM

## 2020-02-17 ENCOUNTER — Ambulatory Visit: Payer: Medicare HMO | Admitting: Internal Medicine

## 2020-02-27 ENCOUNTER — Other Ambulatory Visit: Payer: Self-pay

## 2020-02-27 ENCOUNTER — Encounter: Payer: Self-pay | Admitting: Internal Medicine

## 2020-02-27 ENCOUNTER — Ambulatory Visit (INDEPENDENT_AMBULATORY_CARE_PROVIDER_SITE_OTHER): Payer: Medicare HMO | Admitting: Internal Medicine

## 2020-02-27 VITALS — BP 118/78 | HR 77 | Temp 98.0°F | Ht 64.53 in | Wt 181.8 lb

## 2020-02-27 DIAGNOSIS — E785 Hyperlipidemia, unspecified: Secondary | ICD-10-CM | POA: Diagnosis not present

## 2020-02-27 DIAGNOSIS — Z23 Encounter for immunization: Secondary | ICD-10-CM | POA: Diagnosis not present

## 2020-02-27 DIAGNOSIS — E538 Deficiency of other specified B group vitamins: Secondary | ICD-10-CM

## 2020-02-27 DIAGNOSIS — R928 Other abnormal and inconclusive findings on diagnostic imaging of breast: Secondary | ICD-10-CM

## 2020-02-27 DIAGNOSIS — R69 Illness, unspecified: Secondary | ICD-10-CM | POA: Diagnosis not present

## 2020-02-27 DIAGNOSIS — F419 Anxiety disorder, unspecified: Secondary | ICD-10-CM

## 2020-02-27 DIAGNOSIS — M542 Cervicalgia: Secondary | ICD-10-CM | POA: Diagnosis not present

## 2020-02-27 MED ORDER — PRAVASTATIN SODIUM 20 MG PO TABS: 20.0000 mg | ORAL_TABLET | Freq: Every evening | ORAL | 3 refills | Status: DC

## 2020-02-27 NOTE — Progress Notes (Signed)
Chief Complaint  Patient presents with  . Follow-up  . Immunizations    regular flu shot. Patient declines high dose    F/u  1. Hld was on pravastatin 40 mg qd FH mom, dad, sister x 2  2.B12 def B12 1000 mcg otc and SAME 400 mg qd otc supplement with Emerge C 3.right neck pain radiating to right scalp intermittent yet chronic and associated with h/a and nausea tries tylenol, ibuprofen w/o relief and has tried zanaflex 4 mg prn in the past for another issue but declines refill for now  4. Anxiety/mood d/o and stress celexa 10 mg caused sweating h/a, brain fog stopped after 2 weeks and did not do therapy b/c prefers female and in person but wants to hold for now    Review of Systems  Constitutional: Negative for weight loss.  HENT: Negative for hearing loss.   Eyes: Negative for blurred vision.  Respiratory: Negative for shortness of breath.   Cardiovascular: Negative for chest pain.  Gastrointestinal: Negative for abdominal pain.  Musculoskeletal: Negative for falls and joint pain.  Skin: Negative for rash.  Neurological: Negative for headaches.  Psychiatric/Behavioral: Negative for depression.       +life stressors and a lot of known deaths     Past Medical History:  Diagnosis Date  . Allergic rhinitis   . Anxiety   . Arthritis    Osteoarthritis mild DDD lumbar spine noted in 2018   . Headache   . Stress   . Trochanteric bursitis    Past Surgical History:  Procedure Laterality Date  . CATARACT EXTRACTION     b/l 10 and 04/2018  . CERVICAL CERCLAGE      X 3  . JOINT REPLACEMENT    . TOTAL HIP ARTHROPLASTY Right 08/24/2014   Dr. Marry Guan, [facility]  . TOTAL HIP ARTHROPLASTY Left 09/25/2016   Procedure: TOTAL HIP ARTHROPLASTY;  Surgeon: Dereck Leep, MD;  Location: [facility] ORS;  Service: Orthopedics;  Laterality: Left;   Family History  Problem Relation Age of Onset  . Osteoporosis Mother   . Arthritis/Rheumatoid Mother   . Hyperlipidemia Mother   . Hypertension Father   .  Lung cancer Father        deid 37  . Other Father        brain tumor  . Hyperlipidemia Father   . CAD Father        s/p cabg  . Other Sister        brain tumor glioblastoma died as of 06/13/2019  . Hypercalcemia Sister   . Gout Son   . Other Son        cognitive d/o  . Hyperlipidemia Sister    Social History   Socioeconomic History  . Marital status: Divorced    Spouse name: Not on file  . Number of children: Not on file  . Years of education: Not on file  . Highest education level: Not on file  Occupational History  . Not on file  Tobacco Use  . Smoking status: Former Smoker    Packs/day: 1.00    Types: Cigarettes    Quit date: 08/13/2014    Years since quitting: 5.5  . Smokeless tobacco: Never Used  Vaping Use  . Vaping Use: Former  Substance and Sexual Activity  . Alcohol use: No  . Drug use: No  . Sexual activity: Not on file  Other Topics Concern  . 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 Difficulty of Paying Living Expenses: Not on file  Food Insecurity:   . Worried About Charity fundraiser in the Last Year: Not on file  . Ran Out of Food in the Last Year: Not on file  Transportation Needs:   . Lack of Transportation (Medical): Not on file  . Lack of Transportation (Non-Medical): Not on file  Physical Activity:   . Days of Exercise per Week: Not on file  . Minutes of Exercise per Session: Not on file  Stress:   . Feeling of Stress : Not on file  Social Connections:   . Frequency of Communication with Friends and Family: Not on file  . Frequency of Social Gatherings with Friends and Family: Not on file  . Attends Religious Services: Not on file  . Active Member of Clubs or Organizations: Not on file   . Attends Archivist Meetings: Not on file  . Marital Status: Not on file  Intimate Partner Violence:   . Fear of Current or Ex-Partner: Not on file  . Emotionally Abused: Not on file  . Physically Abused: Not on file  . Sexually Abused: Not on file   Current Meds  Medication Sig  . acetaminophen (TYLENOL) 500 MG tablet Take 500 mg by mouth every 6 (six) hours as needed for mild pain.  . Artificial Tear Solution (SOOTHE XP) SOLN Apply 1-2 drops to eye as needed (dry eye).  . ASPIRIN 81 PO Take by mouth daily in the afternoon.  . Calcium-Vitamins C & D (CALCIUM/C/D PO) Take by mouth.  . Cholecalciferol (VITAMIN D3) 2000 units capsule Take 2,000 Units by mouth daily.  . fexofenadine (ALLEGRA) 180 MG tablet Take 180 mg by mouth daily as needed for allergies or rhinitis.  Marland Kitchen Ketotifen Fumarate (EYE ITCH RELIEF OP) Place 1 drop into both eyes daily.  . Multiple Vitamin (MULTIVITAMIN ADULT PO) Take by mouth.  . Multiple Vitamins-Minerals (EMERGEN-C IMMUNE PO) Take by mouth once a week.  . S-Adenosylmethionine (SAME) 400 MG TABS Take by mouth.  . vitamin B-12 (CYANOCOBALAMIN) 1000 MCG tablet Take 1,000 mcg by mouth daily.   Allergies  Allergen Reactions  . Celexa [Citalopram]     H/a, sweating, brain fog  . Paxil [Paroxetine]     Sweating increased    . Wellbutrin [Bupropion] Itching  . Zoloft [Sertraline Hcl] Itching   Recent Results (from the past 2160 hour(s))  B12     Status: Abnormal   Collection Time: 01/21/20  9:23 AM  Result Value Ref Range   Vitamin B-12 173 (L) 211 - 911 pg/mL  Urine Culture     Status: None   Collection Time: 01/21/20  9:23 AM   Specimen: Blood  Result Value Ref Range   MICRO NUMBER: [id number]    SPECIMEN QUALITY: Adequate    Sample Source NOT GIVEN    STATUS: FINAL    ISOLATE 1:      Growth of mixed flora was isolated, suggesting probable contamination. No further testing will be performed. If clinically indicated, recollection using a  method to minimize contamination, with prompt transfer to Urine Culture Transport Tube, is  recommended.   Urinalysis, Routine w reflex microscopic     Status: Abnormal   Collection Time: 01/21/20  9:23 AM  Result Value Ref Range   Color, Urine YELLOW Yellow;Lt. Yellow;Straw;Dark Yellow;Amber;Green;Red;Brown   APPearance CLEAR Clear;Turbid;Slightly Cloudy;Cloudy   Specific Gravity, Urine <=1.005 (A) 1.000 - 1.030   pH 5.5 5.0 - 8.0   Total Protein, Urine NEGATIVE Negative   Urine Glucose NEGATIVE Negative   Ketones, ur NEGATIVE Negative   Bilirubin Urine NEGATIVE Negative   Hgb urine dipstick NEGATIVE Negative   Urobilinogen, UA 0.2 0.0 - 1.0   Leukocytes,Ua NEGATIVE Negative   Nitrite NEGATIVE Negative   WBC, UA 0-2/hpf 0-2/hpf   RBC / HPF none seen 0-2/hpf   Squamous Epithelial / LPF Rare(0-4/hpf) Rare(0-4/hpf)  TSH     Status: None   Collection Time: 01/21/20  9:23 AM  Result Value Ref Range   TSH 0.76 0.35 - 4.50 uIU/mL  CBC with Differential/Platelet     Status: None   Collection Time: 01/21/20  9:23 AM  Result Value Ref Range   WBC 7.7 4.0 - 10.5 K/uL   RBC 4.77 3.87 - 5.11 Mil/uL   Hemoglobin 13.6 12.0 - 15.0 g/dL   HCT 40.8 36 - 46 %   MCV 85.7 78.0 - 100.0 fl   MCHC 33.4 30.0 - 36.0 g/dL   RDW 14.8 11.5 - 15.5 %   Platelets 275.0 150 - 400 K/uL   Neutrophils Relative % 55.2 43 - 77 %   Lymphocytes Relative 33.6 12 - 46 %   Monocytes Relative 6.9 3 - 12 %   Eosinophils Relative 2.9 0 - 5 %   Basophils Relative 1.4 0 - 3 %   Neutro Abs 4.2 1.4 - 7.7 K/uL   Lymphs Abs 2.6 0.7 - 4.0 K/uL   Monocytes Absolute 0.5 0.1 - 1.0 K/uL   Eosinophils Absolute 0.2 0.0 - 0.7 K/uL   Basophils Absolute 0.1 0.0 - 0.1 K/uL  Lipid panel     Status: Abnormal   Collection Time: 01/21/20  9:23 AM  Result Value Ref Range   Cholesterol 256 (H) 0 - 200 mg/dL    Comment: ATP III Classification       Desirable:  < 200 mg/dL               Borderline High:  200 - 239 mg/dL          High:   > = 240 mg/dL   Triglycerides 118.0 0 - 149 mg/dL    Comment: Normal:  <150 mg/dLBorderline High:  150 - 199 mg/dL   HDL 64.20 >39.00 mg/dL   VLDL 23.6 0.0 - 40.0 mg/dL   LDL Cholesterol 168 (H) 0 - 99 mg/dL   Total CHOL/HDL Ratio 4     Comment:                Men          Women1/2 Average Risk     3.4          3.3Average Risk          5.0          4.42X Average Risk          9.6          7.13X Average Risk          15.0          11.0  NonHDL 191.97     Comment: NOTE:  Non-HDL goal should be 30 mg/dL higher than patient's LDL goal (i.e. LDL goal of < 70 mg/dL, would have non-HDL goal of < 100 mg/dL)  Comprehensive metabolic panel     Status: Abnormal   Collection Time: 01/21/20  9:23 AM  Result Value Ref Range   Sodium 138 135 - 145 mEq/L   Potassium 4.6 3.5 - 5.1 mEq/L   Chloride 103 96 - 112 mEq/L   CO2 30 19 - 32 mEq/L   Glucose, Bld 91 70 - 99 mg/dL   BUN 15 6 - 23 mg/dL   Creatinine, Ser 0.94 0.40 - 1.20 mg/dL   Total Bilirubin 0.6 0.2 - 1.2 mg/dL   Alkaline Phosphatase 101 39 - 117 U/L   AST 23 0 - 37 U/L   ALT 26 0 - 35 U/L   Total Protein 6.8 6.0 - 8.3 g/dL   Albumin 4.3 3.5 - 5.2 g/dL   GFR 59.30 (L) >60.00 mL/min   Calcium 9.8 8.4 - 10.5 mg/dL   Objective  Body mass index is 30.7 kg/m. Wt Readings from Last 3 Encounters:  02/27/20 181 lb 12.8 oz (82.5 kg)  01/01/20 183 lb (83 kg)  07/03/19 180 lb 12.8 oz (82 kg)   Temp Readings from Last 3 Encounters:  02/27/20 98 F (36.7 C) (Oral)  01/01/20 97.8 F (36.6 C)  01/07/19 98.3 F (36.8 C) (Oral)   BP Readings from Last 3 Encounters:  02/27/20 118/78  01/01/20 120/78  07/03/19 119/70   Pulse Readings from Last 3 Encounters:  02/27/20 77  01/01/20 71  07/03/19 66    Physical Exam Vitals and nursing note reviewed.  Constitutional:      Appearance: Normal appearance. She is well-developed and well-groomed. She is obese.  HENT:     Head: Normocephalic and atraumatic.  Eyes:      Conjunctiva/sclera: Conjunctivae normal.     Pupils: Pupils are equal, round, and reactive to light.  Cardiovascular:     Rate and Rhythm: Normal rate and regular rhythm.     Heart sounds: Normal heart sounds. No murmur heard.   Pulmonary:     Effort: Pulmonary effort is normal.     Breath sounds: Normal breath sounds.  Skin:    General: Skin is warm and dry.  Neurological:     General: No focal deficit present.     Mental Status: She is alert and oriented to person, place, and time. Mental status is at baseline.     Gait: Gait normal.  Psychiatric:        Attention and Perception: Attention and perception normal.        Mood and Affect: Mood and affect normal.        Speech: Speech normal.        Behavior: Behavior normal. Behavior is cooperative.        Thought Content: Thought content normal.        Cognition and Memory: Cognition and memory normal.        Judgment: Judgment normal.     Assessment  Plan  Hyperlipidemia, unspecified hyperlipidemia type - Plan: pravastatin (PRAVACHOL) 20 MG tablet, Comprehensive metabolic panel, Lipid panel  Cervicalgia ? Occipital migraine with associated nausea -disc zanaflex 4 mg prn and consider Xray declines for now  Advise check BP when gets these h/a  Consider MRI brain and neurology in future   B12 deficiency - Plan: B12 B12 1000 mcg  Anxiety  Off celexa 10 mg qd   HM Flu shot 02/27/20 low dose per pt request  discf/u with prevnar and shingrix cost $45 Rx given utd Tdap and pna 23 2/2 pfizer covid 19 vaccine will think about booster pna 23 vaccine utd  02/11/19 hadmammogramdue repeat dx right mammo 6 month f/u had 08/21/19 which rec b/l diag mammo for stability order in pt to call to schedule  As of 02/27/20 pt wants to wait 2/2 cost due now    DEXA in 2015 DEXA osteopenia DEXA 01/31/19 osteopenia  Colonoscopy 11/20/13 hyperplastic polyps and anal lesion -pt considering removal will need referral in future if  interestedshe declines referral today 01/08/19 and 01/01/20 disc Dr. Bary Castilla or Dr. Dorie Rank or [facility] Dr. Mirian Mo   Pap 10/17/13 negative   hcv neg 09/03/11  Former smoker quit in 2016 smoked x 20 years max 1-2 ppd FH father lung cancer  Skin-no issues as of 8/26/20not assessed 01/01/20   Ortho Dr. Marry Guan   As of 02/27/20 Eye md Dr. Gloriann Loan resch after 02/2020 h/o floaters s/p cataracts better  Worse right + blurry right eyelid droppy Provider: Dr. Olivia Mackie McLean-Scocuzza-[facility]

## 2020-02-27 NOTE — Patient Instructions (Addendum)
Therapy  [facility]  [facility]   High Cholesterol  High cholesterol is a condition in which the blood has high levels of a white, waxy, fat-like substance (cholesterol). The human body needs small amounts of cholesterol. The liver makes all the cholesterol that the body needs. Extra (excess) cholesterol comes from the food that we eat. Cholesterol is carried from the liver by the blood through the blood vessels. If you have high cholesterol, deposits (plaques) may build up on the walls of your blood vessels (arteries). Plaques make the arteries narrower and stiffer. Cholesterol plaques increase your risk for heart attack and stroke. Work with your health care provider to keep your cholesterol levels in a healthy range. What increases the risk? This condition is more likely to develop in people who:  Eat foods that are high in animal fat (saturated fat) or cholesterol.  Are overweight.  Are not getting enough exercise.  Have a family history of high cholesterol. What are the signs or symptoms? There are no symptoms of this condition. How is this diagnosed? This condition may be diagnosed from the results of a blood test.  If you are older than age 87, your health care provider may check your cholesterol every 4-6 years.  You may be checked more often if you already have high cholesterol or other risk factors for heart disease. The blood test for cholesterol measures:  "Bad" cholesterol (LDL cholesterol). This is the main type of cholesterol that causes heart disease. The desired level for LDL is less than 100.  "Good" cholesterol (HDL cholesterol). This type helps to protect against heart disease by cleaning the arteries and carrying the LDL away. The desired level for HDL is 60 or higher.  Triglycerides. These are fats that the body can store or burn for energy. The desired number for triglycerides is lower than 150.  Total cholesterol. This is a measure of the total  amount of cholesterol in your blood, including LDL cholesterol, HDL cholesterol, and triglycerides. A healthy number is less than 200. How is this treated? This condition is treated with diet changes, lifestyle changes, and medicines. Diet changes  This may include eating more whole grains, fruits, vegetables, nuts, and fish.  This may also include cutting back on red meat and foods that have a lot of added sugar. Lifestyle changes  Changes may include getting at least 40 minutes of aerobic exercise 3 times a week. Aerobic exercises include walking, biking, and swimming. Aerobic exercise along with a healthy diet can help you maintain a healthy weight.  Changes may also include quitting smoking. Medicines  Medicines are usually given if diet and lifestyle changes have failed to reduce your cholesterol to healthy levels.  Your health care provider may prescribe a statin medicine. Statin medicines have been shown to reduce cholesterol, which can reduce the risk of heart disease. Follow these instructions at home: Eating and drinking If told by your health care provider:  Eat chicken (without skin), fish, veal, shellfish, ground [place] breast, and round or loin cuts of red meat.  Do not eat fried foods or fatty meats, such as hot dogs and salami.  Eat plenty of fruits, such as apples.  Eat plenty of vegetables, such as broccoli, potatoes, and carrots.  Eat beans, peas, and lentils.  Eat grains such as barley, rice, couscous, and bulgur wheat.  Eat pasta without cream sauces.  Use skim or nonfat milk, and eat low-fat or nonfat yogurt and cheeses.  Do not eat  or drink whole milk, cream, ice cream, egg yolks, or hard cheeses.  Do not eat stick margarine or tub margarines that contain trans fats (also called partially hydrogenated oils).  Do not eat saturated tropical oils, such as coconut oil and palm oil.  Do not eat cakes, cookies, crackers, or other baked goods that contain  trans fats.  General instructions  Exercise as directed by your health care provider. Increase your activity level with activities such as gardening, walking, and taking the stairs.  Take over-the-counter and prescription medicines only as told by your health care provider.  Do not use any products that contain nicotine or tobacco, such as cigarettes and e-cigarettes. If you need help quitting, ask your health care provider.  Keep all follow-up visits as told by your health care provider. This is important. Contact a health care provider if:  You are struggling to maintain a healthy diet or weight.  You need help to start on an exercise program.  You need help to stop smoking. Get help right away if:  You have chest pain.  You have trouble breathing. This information is not intended to replace advice given to you by your health care provider. Make sure you discuss any questions you have with your health care provider. Document Revised: 05/04/2017 Document Reviewed: 10/30/2015 Elsevier Patient Education  [place].  Cholesterol Content in Foods Cholesterol is a waxy, fat-like substance that helps to carry fat in the blood. The body needs cholesterol in small amounts, but too much cholesterol can cause damage to the arteries and heart. Most people should eat less than 200 milligrams (mg) of cholesterol a day. Foods with cholesterol  Cholesterol is found in animal-based foods, such as meat, seafood, and dairy. Generally, low-fat dairy and lean meats have less cholesterol than full-fat dairy and fatty meats. The milligrams of cholesterol per serving (mg per serving) of common cholesterol-containing foods are listed below. Meat and other proteins  Egg -- one large whole egg has 186 mg.  Veal shank -- 4 oz has 141 mg.  Lean ground [place] (93% lean) -- 4 oz has 118 mg.  Fat-trimmed lamb loin -- 4 oz has 106 mg.  Lean ground beef (90% lean) -- 4 oz has 100 mg.  Lobster  -- 3.5 oz has 90 mg.  Pork loin chops -- 4 oz has 86 mg.  Canned salmon -- 3.5 oz has 83 mg.  Fat-trimmed beef top loin -- 4 oz has 78 mg.  Frankfurter -- 1 frank (3.5 oz) has 77 mg.  Crab -- 3.5 oz has 71 mg.  Roasted chicken without skin, white meat -- 4 oz has 66 mg.  Light bologna -- 2 oz has 45 mg.  Deli-cut [place] -- 2 oz has 31 mg.  Canned tuna -- 3.5 oz has 31 mg.  Berniece Salines -- 1 oz has 29 mg.  Oysters and mussels (raw) -- 3.5 oz has 25 mg.  Mackerel -- 1 oz has 22 mg.  Trout -- 1 oz has 20 mg.  Pork sausage -- 1 link (1 oz) has 17 mg.  Salmon -- 1 oz has 16 mg.  Tilapia -- 1 oz has 14 mg. Dairy  Soft-serve ice cream --  cup (4 oz) has 103 mg.  Whole-milk yogurt -- 1 cup (8 oz) has 29 mg.  Cheddar cheese -- 1 oz has 28 mg.  American cheese -- 1 oz has 28 mg.  Whole milk -- 1 cup (8 oz) has 23 mg.  2% milk -- 1 cup (8 oz) has 18 mg.  Cream cheese -- 1 tablespoon (Tbsp) has 15 mg.  Cottage cheese --  cup (4 oz) has 14 mg.  Low-fat (1%) milk -- 1 cup (8 oz) has 10 mg.  Sour cream -- 1 Tbsp has 8.5 mg.  Low-fat yogurt -- 1 cup (8 oz) has 8 mg.  Nonfat Greek yogurt -- 1 cup (8 oz) has 7 mg.  Half-and-half cream -- 1 Tbsp has 5 mg. Fats and oils  Cod liver oil -- 1 tablespoon (Tbsp) has 82 mg.  Butter -- 1 Tbsp has 15 mg.  Lard -- 1 Tbsp has 14 mg.  Bacon grease -- 1 Tbsp has 14 mg.  Mayonnaise -- 1 Tbsp has 5-10 mg.  Margarine -- 1 Tbsp has 3-10 mg. Exact amounts of cholesterol in these foods may vary depending on specific ingredients and brands. Foods without cholesterol Most plant-based foods do not have cholesterol unless you combine them with a food that has cholesterol. Foods without cholesterol include:  Grains and cereals.  Vegetables.  Fruits.  Vegetable oils, such as olive, canola, and sunflower oil.  Legumes, such as peas, beans, and lentils.  Nuts and seeds.  Egg whites. Summary  The body needs cholesterol in  small amounts, but too much cholesterol can cause damage to the arteries and heart.  Most people should eat less than 200 milligrams (mg) of cholesterol a day. This information is not intended to replace advice given to you by your health care provider. Make sure you discuss any questions you have with your health care provider. Document Revised: 04/13/2017 Document Reviewed: 12/26/2016 Elsevier Patient Education  [place].

## 2020-03-09 ENCOUNTER — Telehealth: Payer: Self-pay | Admitting: Internal Medicine

## 2020-03-09 NOTE — Telephone Encounter (Signed)
Left message for patient to call back and schedule Medicare Annual Wellness Visit (AWV)  ° °This should be a telephone visit only=30 minutes. ° °No hx of AWV; please schedule at anytime with Denisa O'Brien-Blaney at [place] ° ° °

## 2020-03-15 ENCOUNTER — Telehealth: Payer: Self-pay | Admitting: Internal Medicine

## 2020-03-15 DIAGNOSIS — R69 Illness, unspecified: Secondary | ICD-10-CM | POA: Diagnosis not present

## 2020-03-15 NOTE — Telephone Encounter (Signed)
lft vm for pt to call ofc to [facility] Diagnostic mammo.

## 2020-04-02 DIAGNOSIS — Z0100 Encounter for examination of eyes and vision without abnormal findings: Secondary | ICD-10-CM | POA: Diagnosis not present

## 2020-04-02 DIAGNOSIS — H524 Presbyopia: Secondary | ICD-10-CM | POA: Diagnosis not present

## 2020-04-15 ENCOUNTER — Telehealth: Payer: Self-pay | Admitting: Internal Medicine

## 2020-04-15 NOTE — Telephone Encounter (Signed)
lft vm for pt to call ofc to sch diag mammo.

## 2020-05-24 ENCOUNTER — Ambulatory Visit: Payer: Medicare HMO | Attending: Internal Medicine

## 2020-05-24 DIAGNOSIS — Z23 Encounter for immunization: Secondary | ICD-10-CM

## 2020-05-24 NOTE — Progress Notes (Signed)
   Covid-19 Vaccination Clinic  Name:  Shannon [name]    MRN: [id number] DOB: February 09, 1951  05/24/2020  Shannon [name] was observed post Covid-19 immunization for 15 minutes without incident. She was provided with Vaccine Information Sheet and instruction to access the V-Safe system.   Shannon [name] was instructed to call 911 with any severe reactions post vaccine: Marland Kitchen Difficulty breathing  . Swelling of face and throat  . A fast heartbeat  . A bad rash all over body  . Dizziness and weakness   Immunizations Administered    Name Date Dose VIS Date Route   Pfizer COVID-19 Vaccine 05/24/2020  3:30 PM 0.3 mL 03/03/2020 Intramuscular   Manufacturer: [place]   Lot: [id number]   NDC: [address]-8

## 2020-06-07 ENCOUNTER — Other Ambulatory Visit: Payer: Medicare HMO

## 2020-06-07 DIAGNOSIS — Z20822 Contact with and (suspected) exposure to covid-19: Secondary | ICD-10-CM | POA: Diagnosis not present

## 2020-06-08 LAB — NOVEL CORONAVIRUS, NAA: SARS-CoV-2, NAA: DETECTED — AB

## 2020-06-08 LAB — SARS-COV-2, NAA 2 DAY TAT

## 2020-06-23 ENCOUNTER — Ambulatory Visit: Payer: Medicare HMO

## 2020-06-29 ENCOUNTER — Other Ambulatory Visit (INDEPENDENT_AMBULATORY_CARE_PROVIDER_SITE_OTHER): Payer: Medicare HMO

## 2020-06-29 ENCOUNTER — Other Ambulatory Visit: Payer: Self-pay

## 2020-06-29 DIAGNOSIS — E538 Deficiency of other specified B group vitamins: Secondary | ICD-10-CM

## 2020-06-29 DIAGNOSIS — E785 Hyperlipidemia, unspecified: Secondary | ICD-10-CM

## 2020-06-29 LAB — COMPREHENSIVE METABOLIC PANEL
ALT: 28 U/L (ref 0–35)
AST: 29 U/L (ref 0–37)
Albumin: 4.2 g/dL (ref 3.5–5.2)
Alkaline Phosphatase: 87 U/L (ref 39–117)
BUN: 15 mg/dL (ref 6–23)
CO2: 29 mEq/L (ref 19–32)
Calcium: 10.1 mg/dL (ref 8.4–10.5)
Chloride: 103 mEq/L (ref 96–112)
Creatinine, Ser: 0.95 mg/dL (ref 0.40–1.20)
GFR: 61.8 mL/min (ref >60.00)
Glucose, Bld: 90 mg/dL (ref 70–99)
Potassium: 4.8 mEq/L (ref 3.5–5.1)
Sodium: 139 mEq/L (ref 135–145)
Total Bilirubin: 0.5 mg/dL (ref 0.2–1.2)
Total Protein: 6.9 g/dL (ref 6.0–8.3)

## 2020-06-29 LAB — VITAMIN B12: Vitamin B-12: 776 pg/mL (ref 211–911)

## 2020-06-29 LAB — LIPID PANEL
Cholesterol: 192 mg/dL (ref 0–200)
HDL: 72 mg/dL (ref >39.00)
LDL Cholesterol: 106 mg/dL — ABNORMAL HIGH (ref 0–99)
NonHDL: 119.9
Total CHOL/HDL Ratio: 3
Triglycerides: 72 mg/dL (ref 0.0–149.0)
VLDL: 14.4 mg/dL (ref 0.0–40.0)

## 2020-08-31 ENCOUNTER — Other Ambulatory Visit: Payer: Self-pay

## 2020-08-31 ENCOUNTER — Ambulatory Visit (INDEPENDENT_AMBULATORY_CARE_PROVIDER_SITE_OTHER): Payer: Medicare HMO | Admitting: Internal Medicine

## 2020-08-31 ENCOUNTER — Encounter: Payer: Self-pay | Admitting: Internal Medicine

## 2020-08-31 VITALS — BP 116/80 | HR 57 | Temp 97.4°F | Ht 64.53 in | Wt 194.4 lb

## 2020-08-31 DIAGNOSIS — K629 Disease of anus and rectum, unspecified: Secondary | ICD-10-CM

## 2020-08-31 DIAGNOSIS — R0602 Shortness of breath: Secondary | ICD-10-CM

## 2020-08-31 DIAGNOSIS — Z0000 Encounter for general adult medical examination without abnormal findings: Secondary | ICD-10-CM

## 2020-08-31 DIAGNOSIS — T7840XD Allergy, unspecified, subsequent encounter: Secondary | ICD-10-CM | POA: Diagnosis not present

## 2020-08-31 DIAGNOSIS — Z0001 Encounter for general adult medical examination with abnormal findings: Secondary | ICD-10-CM

## 2020-08-31 DIAGNOSIS — E785 Hyperlipidemia, unspecified: Secondary | ICD-10-CM | POA: Diagnosis not present

## 2020-08-31 MED ORDER — PRAVASTATIN SODIUM 20 MG PO TABS: 20.0000 mg | ORAL_TABLET | ORAL | 3 refills | Status: DC

## 2020-08-31 NOTE — Progress Notes (Signed)
Chief Complaint  Patient presents with  . Follow-up   Annual  1. HLD pravachol causing achy muscles and memory loss though cholesterol improved Shannon [name] wants to stop  2. PHQ 9 score 4 and GAD 7 score 3 a lot of deaths recently people Shannon [name] knows 3. C/o allergies with sob takes allegra  4. PHQ 9 score 4 and GAD 7 score 3 today see #2  5. C/o chronic right hip pain 4/10 today est with ortho  Review of Systems  Constitutional: Negative for weight loss.  HENT: Negative for hearing loss.   Eyes: Negative for blurred vision.  Respiratory: Positive for shortness of breath. Negative for sputum production.   Cardiovascular: Negative for chest pain.  Gastrointestinal: Negative for abdominal pain.  Skin: Negative for rash.  Psychiatric/Behavioral: Negative for depression. The patient is not nervous/anxious.        +grief     Past Medical History:  Diagnosis Date  . Allergic rhinitis   . Anal fissure    child  . Anxiety   . Arthritis    Osteoarthritis mild DDD lumbar spine noted in 2018   . COVID-19    05/2020  . Headache   . Stress   . Trochanteric bursitis    Past Surgical History:  Procedure Laterality Date  . CATARACT EXTRACTION     b/l 10 and 04/2018  . CERVICAL CERCLAGE      X 3  . JOINT REPLACEMENT    . TOTAL HIP ARTHROPLASTY Right 08/24/2014   Dr. Marry Guan, [facility]  . TOTAL HIP ARTHROPLASTY Left 09/25/2016   Procedure: TOTAL HIP ARTHROPLASTY;  Surgeon: Dereck Leep, MD;  Location: [facility] ORS;  Service: Orthopedics;  Laterality: Left;   Family History  Problem Relation Age of Onset  . Osteoporosis Mother   . Arthritis/Rheumatoid Mother   . Hyperlipidemia Mother   . Hypertension Father   . Lung cancer Father        deid 22  . Other Father        brain tumor  . Hyperlipidemia Father   . CAD Father        s/p cabg  . Other Sister        brain tumor glioblastoma died as of June 01, 2019  . Hypercalcemia Sister   . Gout Son   . Other Son        cognitive d/o  . Hyperlipidemia  Sister    Social History   Socioeconomic History  . Marital status: Divorced    Spouse name: Not on file  . Number of children: Not on file  . Years of education: Not on file  . Highest education level: Not on file  Occupational History  . Not on file  Tobacco Use  . Smoking status: Former Smoker    Packs/day: 1.00    Types: Cigarettes    Quit date: 08/13/2014    Years since quitting: 6.0  . Smokeless tobacco: Never Used  Vaping Use  . Vaping Use: Former  Substance and Sexual Activity  . Alcohol use: No  . Drug use: No  . Sexual activity: Not on file  Other Topics Concern  . 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 acetaminophen (TYLENOL) 500 MG tablet Take 500 mg by mouth every 6 (six) hours as needed for mild pain.  . Artificial Tear Solution (SOOTHE XP) SOLN Apply 1-2 drops to eye as needed (dry eye).  . ASPIRIN 81 PO Take by mouth daily in the afternoon.  . fexofenadine (ALLEGRA) 180 MG tablet Take 180 mg by mouth daily as needed for allergies or rhinitis.  Marland Kitchen Ketotifen Fumarate (EYE ITCH RELIEF OP) Place 1 drop into both eyes daily.  . Multiple Vitamin (MULTIVITAMIN ADULT PO) Take by mouth.  . vitamin B-12 (CYANOCOBALAMIN) 1000 MCG tablet Take 1,000 mcg by mouth daily.  . [DISCONTINUED] pravastatin (PRAVACHOL) 20 MG tablet Take 1 tablet (20 mg total) by mouth at bedtime. After 6 pm   Allergies  Allergen Reactions  . Celexa [Citalopram]     H/a, sweating, brain fog  . Paxil  [Paroxetine]     Sweating increased    . Wellbutrin [Bupropion] Itching  . Zoloft [Sertraline Hcl] Itching   Recent Results (from the past 2160 hour(s))  B12     Status: None   Collection Time: 06/29/20  8:32 AM  Result Value Ref Range   Vitamin B-12 776 211 - 911 pg/mL  Lipid panel     Status: Abnormal   Collection Time: 06/29/20  8:32 AM  Result Value Ref Range   Cholesterol 192 0 - 200 mg/dL    Comment: ATP III Classification       Desirable:  < 200 mg/dL               Borderline High:  200 - 239 mg/dL          High:  > = 240 mg/dL   Triglycerides 72.0 0.0 - 149.0 mg/dL    Comment: Normal:  <150 mg/dLBorderline High:  150 - 199 mg/dL   HDL 72.00 >39.00 mg/dL   VLDL 14.4 0.0 - 40.0 mg/dL   LDL Cholesterol 106 (H) 0 - 99 mg/dL   Total CHOL/HDL Ratio 3     Comment:                Men          Women1/2 Average Risk     3.4          3.3Average Risk          5.0          4.42X Average Risk          9.6          7.13X Average Risk          15.0          11.0                       NonHDL 119.90     Comment: NOTE:  Non-HDL goal should be 30 mg/dL higher than patient's LDL goal (i.e. LDL goal of < 70 mg/dL, would have non-HDL goal of < 100 mg/dL)  Comprehensive metabolic panel     Status: None   Collection Time: 06/29/20  8:32 AM  Result Value Ref Range   Sodium 139 135 -  145 mEq/L   Potassium 4.8 3.5 - 5.1 mEq/L   Chloride 103 96 - 112 mEq/L   CO2 29 19 - 32 mEq/L   Glucose, Bld 90 70 - 99 mg/dL   BUN 15 6 - 23 mg/dL   Creatinine, Ser 0.95 0.40 - 1.20 mg/dL   Total Bilirubin 0.5 0.2 - 1.2 mg/dL   Alkaline Phosphatase 87 39 - 117 U/L   AST 29 0 - 37 U/L   ALT 28 0 - 35 U/L   Total Protein 6.9 6.0 - 8.3 g/dL   Albumin 4.2 3.5 - 5.2 g/dL   GFR 61.80 >60.00 mL/min    Comment: Calculated using the CKD-EPI Creatinine Equation (2021)   Calcium 10.1 8.4 - 10.5 mg/dL   Objective  Body mass index is 32.82 kg/m. Wt Readings from Last 3 Encounters:  08/31/20 194 lb 6.4 oz (88.2 kg)   02/27/20 181 lb 12.8 oz (82.5 kg)  01/01/20 183 lb (83 kg)   Temp Readings from Last 3 Encounters:  08/31/20 (!) 97.4 F (36.3 C) (Oral)  02/27/20 98 F (36.7 C) (Oral)  01/01/20 97.8 F (36.6 C)   BP Readings from Last 3 Encounters:  08/31/20 116/80  02/27/20 118/78  01/01/20 120/78   Pulse Readings from Last 3 Encounters:  08/31/20 (!) 57  02/27/20 77  01/01/20 71    Physical Exam Vitals and nursing note reviewed.  Constitutional:      Appearance: Normal appearance. Shannon [name] is well-developed and well-groomed. Shannon [name] is obese.  HENT:     Head: Normocephalic and atraumatic.  Cardiovascular:     Rate and Rhythm: Normal rate and regular rhythm.     Heart sounds: Normal heart sounds. No murmur heard.   Pulmonary:     Effort: Pulmonary effort is normal.     Breath sounds: Normal breath sounds.  Abdominal:     Tenderness: There is no abdominal tenderness.  Skin:    General: Skin is warm and dry.  Neurological:     General: No focal deficit present.     Mental Status: Shannon [name] is alert and oriented to person, place, and time. Mental status is at baseline.     Gait: Gait normal.  Psychiatric:        Attention and Perception: Attention and perception normal.        Mood and Affect: Mood and affect normal.        Speech: Speech normal.        Behavior: Behavior normal. Behavior is cooperative.        Thought Content: Thought content normal.        Cognition and Memory: Cognition and memory normal.        Judgment: Judgment normal.     Assessment  Plan  Annual physical exam Flu shot 02/27/20 low dose per pt request  discf/u with prevnar and shingrix cost $45 Rx given utd Tdap and pna 23 3/3 pfizercovid 19 vaccine will think about booster pna 23 vaccine utd  02/11/19 hadmammogramdue repeat dx right mammo 6 monthf/u had 08/21/19 which rec b/l diag mammo for stability order in pt to call to schedule mammo ordered pt needs to call    DEXA in 2015 DEXA osteopenia DEXA  01/31/19 osteopenia  Colonoscopy 11/20/13 hyperplastic polyps and anal lesion -pt considering removal will need referral in future if interestedshe declines referral today 8/26/20and 01/01/20 disc Dr. Bary Castilla or Dr. Dorie Rank or [facility] Dr. Mirian Mo  Pap 10/17/13 negative   hcv neg 09/03/11  Former  smoker quit in 2016 smoked x 20 years max 1-2 ppd FH father lung cancer  Skin-no issues as of 8/26/20not assessed 01/01/20  Ortho Dr. Marry Guan   As of 02/27/20 Eye md Dr. Gloriann Loan resch after 02/2020 h/o floaters s/p cataracts better  Worse right + blurry right eyelid droppy  rec healthy diet and exercise   Anal lesion - Plan: Ambulatory referral to Colorectal Surgery Dr. Audie Clear [facility]   SOB (shortness of breath) SOB (shortness of breath) on exertion ?cardiac vs pulm  Consider echo, cxr, cards referral in future let me know   Hyperlipidemia, unspecified hyperlipidemia type - Plan: pravastatin (PRAVACHOL) 20 MG tablet was on qd agreeable to reduce to 3 days per week to see if helps with sx's   Allergy Cont otc allegra    Provider: Dr. Olivia Mackie McLean-Scocuzza-Internal Medicine

## 2020-08-31 NOTE — Patient Instructions (Addendum)
Dr. Farrel Conners [facility] removal of anal lesion [phone] [facility] [place] location  [address] [place] [phone]   Call for your mammogram they are recommending   Let me know if you want an Xray of your chest or echo of your heart/cardiology referral

## 2020-09-16 DIAGNOSIS — K629 Disease of anus and rectum, unspecified: Secondary | ICD-10-CM | POA: Diagnosis not present

## 2020-10-07 DIAGNOSIS — Z96643 Presence of artificial hip joint, bilateral: Secondary | ICD-10-CM | POA: Diagnosis not present

## 2020-10-21 DIAGNOSIS — K6289 Other specified diseases of anus and rectum: Secondary | ICD-10-CM | POA: Diagnosis not present

## 2020-10-21 DIAGNOSIS — D485 Neoplasm of uncertain behavior of skin: Secondary | ICD-10-CM | POA: Diagnosis not present

## 2020-10-21 DIAGNOSIS — M1990 Unspecified osteoarthritis, unspecified site: Secondary | ICD-10-CM | POA: Diagnosis not present

## 2020-10-21 DIAGNOSIS — D378 Neoplasm of uncertain behavior of other specified digestive organs: Secondary | ICD-10-CM | POA: Diagnosis not present

## 2020-10-21 DIAGNOSIS — K644 Residual hemorrhoidal skin tags: Secondary | ICD-10-CM | POA: Diagnosis not present

## 2020-10-21 DIAGNOSIS — E7800 Pure hypercholesterolemia, unspecified: Secondary | ICD-10-CM | POA: Diagnosis not present

## 2020-10-21 DIAGNOSIS — D235 Other benign neoplasm of skin of trunk: Secondary | ICD-10-CM | POA: Diagnosis not present

## 2020-11-29 DIAGNOSIS — L989 Disorder of the skin and subcutaneous tissue, unspecified: Secondary | ICD-10-CM | POA: Diagnosis not present

## 2020-12-30 ENCOUNTER — Telehealth: Payer: Self-pay

## 2020-12-30 DIAGNOSIS — E785 Hyperlipidemia, unspecified: Secondary | ICD-10-CM

## 2020-12-30 NOTE — Telephone Encounter (Signed)
-----   Message from De Hollingshead, Ross sent at 12/30/2020  8:26 AM EDT ----- See below.   THANKS! ----- Message ----- From: Leone Haven, MD Sent: 12/29/2020   7:22 PM EDT To: De Hollingshead, RPH-CPP  I think it would be fine if you had Suriah Peragine call her to see if she would want to get set up with CCM. ----- Message ----- From: De Hollingshead, RPH-CPP Sent: 12/29/2020   3:08 PM EDT To: Leone Haven, MD  Hey,   This is another Olivia Mackie patient that is in danger of failing adherence measure - it's because at that last visit, Olivia Mackie advised her to reduce pravastatin to three days weekly, but script is still written for 90 tablets, instead of 36.   How do you want to play this? If it was your patient, I'd just ask you to fix the prescription and resend it. I'd also be happy to ask Agueda [facility]ou[facility] to call her, explain CCM, see if she wants to talk to me to follow up on how she's doing with that reduced pravastatin dose.   Thanks!  Catie

## 2021-01-03 MED ORDER — PRAVASTATIN SODIUM 20 MG PO TABS: 20.0000 mg | ORAL_TABLET | ORAL | 3 refills | Status: DC

## 2021-01-03 NOTE — Addendum Note (Signed)
Addended by: Leone Haven on: 01/03/2021 06:39 PM   Modules accepted: Orders

## 2021-01-03 NOTE — Telephone Encounter (Signed)
Prescription quantity updated.  Sent to pharmacy.

## 2021-01-03 NOTE — Telephone Encounter (Signed)
Called and spoke with [place]. Analiese declined seeing Catie. She states that she is doing fine on the lower does of medication and does not need to see anyone.

## 2021-02-25 ENCOUNTER — Other Ambulatory Visit: Payer: Self-pay

## 2021-02-25 ENCOUNTER — Ambulatory Visit
Admission: RE | Admit: 2021-02-25 | Discharge: 2021-02-25 | Disposition: A | Discharge status: Home / Self Care | Payer: Medicare HMO | Source: Ambulatory Visit | Attending: Internal Medicine | Admitting: Internal Medicine

## 2021-02-25 DIAGNOSIS — R922 Inconclusive mammogram: Secondary | ICD-10-CM | POA: Diagnosis not present

## 2021-02-25 DIAGNOSIS — N6489 Other specified disorders of breast: Secondary | ICD-10-CM | POA: Diagnosis not present

## 2021-02-25 DIAGNOSIS — R928 Other abnormal and inconclusive findings on diagnostic imaging of breast: Secondary | ICD-10-CM | POA: Diagnosis not present

## 2021-04-15 DIAGNOSIS — H524 Presbyopia: Secondary | ICD-10-CM | POA: Diagnosis not present

## 2021-04-15 DIAGNOSIS — Z0100 Encounter for examination of eyes and vision without abnormal findings: Secondary | ICD-10-CM | POA: Diagnosis not present

## 2021-06-02 ENCOUNTER — Other Ambulatory Visit: Payer: Self-pay

## 2021-06-02 ENCOUNTER — Ambulatory Visit (INDEPENDENT_AMBULATORY_CARE_PROVIDER_SITE_OTHER): Payer: Medicare HMO | Admitting: Internal Medicine

## 2021-06-02 ENCOUNTER — Encounter: Payer: Self-pay | Admitting: Internal Medicine

## 2021-06-02 VITALS — BP 110/72 | HR 72 | Temp 97.2°F | Ht 64.53 in | Wt 190.0 lb

## 2021-06-02 DIAGNOSIS — R5383 Other fatigue: Secondary | ICD-10-CM

## 2021-06-02 DIAGNOSIS — N390 Urinary tract infection, site not specified: Secondary | ICD-10-CM | POA: Diagnosis not present

## 2021-06-02 DIAGNOSIS — Z1231 Encounter for screening mammogram for malignant neoplasm of breast: Secondary | ICD-10-CM

## 2021-06-02 DIAGNOSIS — Z1329 Encounter for screening for other suspected endocrine disorder: Secondary | ICD-10-CM | POA: Diagnosis not present

## 2021-06-02 DIAGNOSIS — Z23 Encounter for immunization: Secondary | ICD-10-CM

## 2021-06-02 DIAGNOSIS — M19049 Primary osteoarthritis, unspecified hand: Secondary | ICD-10-CM

## 2021-06-02 DIAGNOSIS — Z0000 Encounter for general adult medical examination without abnormal findings: Secondary | ICD-10-CM

## 2021-06-02 DIAGNOSIS — J309 Allergic rhinitis, unspecified: Secondary | ICD-10-CM

## 2021-06-02 DIAGNOSIS — G4700 Insomnia, unspecified: Secondary | ICD-10-CM

## 2021-06-02 DIAGNOSIS — Z1389 Encounter for screening for other disorder: Secondary | ICD-10-CM

## 2021-06-02 DIAGNOSIS — E785 Hyperlipidemia, unspecified: Secondary | ICD-10-CM

## 2021-06-02 DIAGNOSIS — N3281 Overactive bladder: Secondary | ICD-10-CM | POA: Diagnosis not present

## 2021-06-02 LAB — CBC WITH DIFFERENTIAL/PLATELET
Basophils Absolute: 0.1 10*3/uL (ref 0.0–0.1)
Basophils Relative: 0.8 % (ref 0.0–3.0)
Eosinophils Absolute: 0.3 10*3/uL (ref 0.0–0.7)
Eosinophils Relative: 4.6 % (ref 0.0–5.0)
HCT: 41.7 % (ref 36.0–46.0)
Hemoglobin: 13.4 g/dL (ref 12.0–15.0)
Lymphocytes Relative: 28.4 % (ref 12.0–46.0)
Lymphs Abs: 2.1 10*3/uL (ref 0.7–4.0)
MCHC: 32.2 g/dL (ref 30.0–36.0)
MCV: 85.1 fl (ref 78.0–100.0)
Monocytes Absolute: 0.4 10*3/uL (ref 0.1–1.0)
Monocytes Relative: 5.4 % (ref 3.0–12.0)
Neutro Abs: 4.5 10*3/uL (ref 1.4–7.7)
Neutrophils Relative %: 60.8 % (ref 43.0–77.0)
Platelets: 304 10*3/uL (ref 150.0–400.0)
RBC: 4.9 Mil/uL (ref 3.87–5.11)
RDW: 14.6 % (ref 11.5–15.5)
WBC: 7.4 10*3/uL (ref 4.0–10.5)

## 2021-06-02 LAB — LIPID PANEL
Cholesterol: 210 mg/dL — ABNORMAL HIGH (ref 0–200)
HDL: 69.4 mg/dL (ref >39.00)
LDL Cholesterol: 126 mg/dL — ABNORMAL HIGH (ref 0–99)
NonHDL: 140.26
Total CHOL/HDL Ratio: 3
Triglycerides: 71 mg/dL (ref 0.0–149.0)
VLDL: 14.2 mg/dL (ref 0.0–40.0)

## 2021-06-02 LAB — COMPREHENSIVE METABOLIC PANEL
ALT: 26 U/L (ref 0–35)
AST: 27 U/L (ref 0–37)
Albumin: 4.2 g/dL (ref 3.5–5.2)
Alkaline Phosphatase: 99 U/L (ref 39–117)
BUN: 14 mg/dL (ref 6–23)
CO2: 29 mEq/L (ref 19–32)
Calcium: 9.6 mg/dL (ref 8.4–10.5)
Chloride: 102 mEq/L (ref 96–112)
Creatinine, Ser: 0.92 mg/dL (ref 0.40–1.20)
GFR: 63.81 mL/min (ref >60.00)
Glucose, Bld: 85 mg/dL (ref 70–99)
Potassium: 4.2 mEq/L (ref 3.5–5.1)
Sodium: 138 mEq/L (ref 135–145)
Total Bilirubin: 0.4 mg/dL (ref 0.2–1.2)
Total Protein: 6.8 g/dL (ref 6.0–8.3)

## 2021-06-02 LAB — URINALYSIS, ROUTINE W REFLEX MICROSCOPIC
Bilirubin Urine: NEGATIVE
Hgb urine dipstick: NEGATIVE
Ketones, ur: NEGATIVE
Leukocytes,Ua: NEGATIVE
Nitrite: NEGATIVE
Specific Gravity, Urine: 1.01 (ref 1.000–1.030)
Total Protein, Urine: NEGATIVE
Urine Glucose: NEGATIVE
Urobilinogen, UA: 0.2 (ref 0.0–1.0)
pH: 7 (ref 5.0–8.0)

## 2021-06-02 LAB — TSH: TSH: 0.62 u[IU]/mL (ref 0.35–5.50)

## 2021-06-02 MED ORDER — SHINGRIX 50 MCG/0.5ML IM SUSR: 0.5000 mL | Freq: Once | INTRAMUSCULAR | 1 refills | Status: AC

## 2021-06-02 MED ORDER — SALINE SPRAY 0.65 % NA SOLN: 2.0000 | NASAL | 12 refills | Status: AC | PRN

## 2021-06-02 MED ORDER — FLUTICASONE PROPIONATE 50 MCG/ACT NA SUSP: 2.0000 | Freq: Every day | NASAL | 11 refills | Status: DC

## 2021-06-02 NOTE — Patient Instructions (Addendum)
Web md or [facility] angular chelitis  Use aquaphor oint, clotrimazole or lamisil + hydrocortisone 1-2 x per day  Melatonin 5-10 mg nature made  Tranquil sleep brand stress relax (melatonin, serotonin or L theanine)  Consider trazadone  Sleepy time tea chamomile   hands Voltaren  Aspercream with lidocaine   Xyzal consider  Nasal saline 2 sprays followed by flonase or nasocort   Allergic Rhinitis, Adult Allergic rhinitis is an allergic reaction that affects the mucous membrane inside the nose. The mucous membrane is the tissue that produces mucus. There are two types of allergic rhinitis: Seasonal. This type is also called hay fever and happens only during certain seasons. Perennial. This type can happen at any time of the year. Allergic rhinitis cannot be spread from person to person. This condition can be mild, moderate, or severe. It can develop at any age and may be outgrown. What are the causes? This condition is caused by allergens. These are things that can cause an allergic reaction. Allergens may differ for seasonal allergic rhinitis and perennial allergic rhinitis. Seasonal allergic rhinitis is triggered by pollen. Pollen can come from grasses, trees, and weeds. Perennial allergic rhinitis may be triggered by: Dust mites. Proteins in a pet's urine, saliva, or dander. Dander is dead skin cells from a pet. Smoke, mold, or car fumes. What increases the risk? You are more likely to develop this condition if you have a family history of allergies or other conditions related to allergies, including: Allergic conjunctivitis. This is inflammation of parts of the eyes and eyelids. Asthma. This condition affects the lungs and makes it hard to breathe. Atopic dermatitis or eczema. This is long term (chronic) inflammation of the skin. Food allergies. What are the signs or symptoms? Symptoms of this condition include: Sneezing or coughing. A stuffy nose (nasal congestion), itchy  nose, or nasal discharge. Itchy eyes and tearing of the eyes. A feeling of mucus dripping down the back of your throat (postnasal drip). Trouble sleeping. Tiredness or fatigue. Headache. Sore throat. How is this diagnosed? This condition may be diagnosed with your symptoms, medical history, and physical exam. Your health care provider may check for related conditions, such as: Asthma. Pink eye. This is eye inflammation caused by infection (conjunctivitis). Ear infection. Upper respiratory infection. This is an infection in the nose, throat, or upper airways. You may also have tests to find out which allergens trigger your symptoms. These may include skin tests or blood tests. How is this treated? There is no cure for this condition, but treatment can help control symptoms. Treatment may include: Taking medicines that block allergy symptoms, such as corticosteroids and antihistamines. Medicine may be given as a shot, nasal spray, or pill. Avoiding any allergens. Being exposed again and again to tiny amounts of allergens to help you build a defense against allergens (immunotherapy). This is done if other treatments have not helped. It may include: Allergy shots. These are injected medicines that have small amounts of allergen in them. Sublingual immunotherapy. This involves taking small doses of a medicine with allergen in it under your tongue. If these treatments do not work, your health care provider may prescribe newer, stronger medicines. Follow these instructions at home: Avoiding allergens Find out what you are allergic to and avoid those allergens. These are some things you can do to help avoid allergens: If you have perennial allergies: Replace carpet with wood, tile, or vinyl flooring. Carpet can trap dander and dust. Do not smoke. Do not allow  smoking in your home. Change your heating and air conditioning filters at least once a month. If you have seasonal allergies, take these  steps during allergy season: Keep windows closed as much as possible. Plan outdoor activities when pollen counts are lowest. Check pollen counts before you plan outdoor activities. When coming indoors, change clothing and shower before sitting on furniture or bedding. If you have a pet in the house that produces allergens: Keep the pet out of the bedroom. Vacuum, sweep, and dust regularly. General instructions Take over-the-counter and prescription medicines only as told by your health care provider. Drink enough fluid to keep your urine pale yellow. Keep all follow-up visits as told by your health care provider. This is important. Where to find more information American Academy of Allergy, Asthma & Immunology: www.aaaai.org Contact a health care provider if: You have a fever. You develop a cough that does not go away. You make whistling sounds when you breathe (wheeze). Your symptoms slow you down or stop you from doing your normal activities each day. Get help right away if: You have shortness of breath. This symptom may represent a serious problem that is an emergency. Do not wait to see if the symptom will go away. Get medical help right away. Call your local emergency services (911 in the [place] Do not drive yourself to the hospital. Summary Allergic rhinitis may be managed by taking medicines as directed and avoiding allergens. If you have seasonal allergies, keep windows closed as much as possible during allergy season. Contact your health care provider if you develop a fever or a cough that does not go away. This information is not intended to replace advice given to you by your health care provider. Make sure you discuss any questions you have with your health care provider. Document Revised: 06/20/2019 Document Reviewed: 04/29/2019 Elsevier Patient Education  2022 [employer].

## 2021-06-02 NOTE — Progress Notes (Signed)
Chief Complaint  Patient presents with   Follow-up   F/u  1. Hld on pravachol 20 mg MWF 2. C/o hand arthritis and right wrist pain tried brace and helps and has voltaren gel 3. C/o insomnia disc melatonin 5-10 mg otc to try  4. Allergies and PND, congestion sx's  Review of Systems  Constitutional:  Negative for weight loss.  HENT:  Negative for hearing loss.   Eyes:  Negative for blurred vision.  Respiratory:  Negative for shortness of breath.   Cardiovascular:  Negative for chest pain.  Gastrointestinal:  Negative for abdominal pain and blood in stool.  Genitourinary:  Negative for dysuria.  Musculoskeletal:  Negative for falls and joint pain.  Skin:  Negative for rash.  Neurological:  Negative for headaches.  Psychiatric/Behavioral:  Negative for depression.   Past Medical History:  Diagnosis Date   Allergic rhinitis    Anal fissure    child   Anxiety    Arthritis    Osteoarthritis mild DDD lumbar spine noted in 2018    COVID-19    05/2020   Headache    Stress    Trochanteric bursitis    Past Surgical History:  Procedure Laterality Date   CATARACT EXTRACTION     b/l 10 and 04/2018   CERVICAL CERCLAGE      X 3   JOINT REPLACEMENT     TOTAL HIP ARTHROPLASTY Right 08/24/2014   Dr. Marry Guan, [place] Left 09/25/2016   Procedure: TOTAL HIP ARTHROPLASTY;  Surgeon: Dereck Leep, MD;  Location: [facility] ORS;  Service: Orthopedics;  Laterality: Left;   Family History  Problem Relation Age of Onset   Osteoporosis Mother    Arthritis/Rheumatoid Mother    Hyperlipidemia Mother    Hypertension Father    Lung cancer Father        deid 62   Other Father        brain tumor   Hyperlipidemia Father    CAD Father        s/p cabg   Other Sister        brain tumor glioblastoma died as of 06/10/2019   Hypercalcemia Sister    Hyperlipidemia Sister    Gout Son    Other Son        cognitive d/o   Breast cancer Neg Hx    Social History   Socioeconomic  History   Marital status: Divorced    Spouse name: Not on file   Number of children: Not on file   Years of education: Not on file   Highest education level: Not on file  Occupational History   Not on file  Tobacco Use   Smoking status: Former    Packs/day: 1.00    Types: Cigarettes    Quit date: 08/13/2014    Years since quitting: 6.8   Smokeless tobacco: Never  Vaping Use   Vaping Use: Former  Substance and Sexual Activity   Alcohol use: No   Drug use: No   Sexual activity: Not on file  Other Topics Concern   Not on file  Social History Narrative   Lives with long term boyfriend 45 years older than her   1 son special needs lives in own place 72 y.o    3 kids total all sons      Chelcy Bolda son DPR      Used to work at [employer]  Resource Strain: Not on file  Food Insecurity: Not on file  Transportation Needs: Not on file  Physical Activity: Not on file  Stress: Not on file  Social Connections: Not on file  Intimate Partner Violence: Not on file   Current Meds  Medication Sig   Artificial Tear Solution (SOOTHE XP) SOLN Apply 1-2 drops to eye as needed (dry eye).   ASPIRIN 81 PO Take by mouth daily in the afternoon.   Cholecalciferol 25 MCG (1000 UT) capsule Take 1,000 Units by mouth at bedtime.   fluticasone (FLONASE) 50 MCG/ACT nasal spray Place 2 sprays into both nostrils daily.   Multiple Vitamin (MULTIVITAMIN ADULT PO) Take by mouth.   pravastatin (PRAVACHOL) 20 MG tablet Take 1 tablet (20 mg total) by mouth every Monday, Wednesday, and Friday. After 6 pm   sodium chloride (OCEAN) 0.65 % SOLN nasal spray Place 2 sprays into both nostrils as needed for congestion.   vitamin B-12 (CYANOCOBALAMIN) 1000 MCG tablet Take 1,000 mcg by mouth daily.   Zoster Vaccine Adjuvanted [facility]) injection Inject 0.5 mLs into the muscle once for 1 dose.   Allergies  Allergen Reactions   Celexa [Citalopram]     H/a, sweating,  brain fog   Paxil [Paroxetine]     Sweating increased     Wellbutrin [Bupropion] Itching   Zoloft [Sertraline Hcl] Itching   Recent Results (from the past 2160 hour(s))  Comprehensive metabolic panel     Status: None   Collection Time: 06/02/21 10:25 AM  Result Value Ref Range   Sodium 138 135 - 145 mEq/L   Potassium 4.2 3.5 - 5.1 mEq/L   Chloride 102 96 - 112 mEq/L   CO2 29 19 - 32 mEq/L   Glucose, Bld 85 70 - 99 mg/dL   BUN 14 6 - 23 mg/dL   Creatinine, Ser 0.92 0.40 - 1.20 mg/dL   Total Bilirubin 0.4 0.2 - 1.2 mg/dL   Alkaline Phosphatase 99 39 - 117 U/L   AST 27 0 - 37 U/L   ALT 26 0 - 35 U/L   Total Protein 6.8 6.0 - 8.3 g/dL   Albumin 4.2 3.5 - 5.2 g/dL   GFR 63.81 >60.00 mL/min    Comment: Calculated using the CKD-EPI Creatinine Equation (2021)   Calcium 9.6 8.4 - 10.5 mg/dL  Lipid panel     Status: Abnormal   Collection Time: 06/02/21 10:25 AM  Result Value Ref Range   Cholesterol 210 (H) 0 - 200 mg/dL    Comment: ATP III Classification       Desirable:  < 200 mg/dL               Borderline High:  200 - 239 mg/dL          High:  > = 240 mg/dL   Triglycerides 71.0 0.0 - 149.0 mg/dL    Comment: Normal:  <150 mg/dLBorderline High:  150 - 199 mg/dL   HDL 69.40 >39.00 mg/dL   VLDL 14.2 0.0 - 40.0 mg/dL   LDL Cholesterol 126 (H) 0 - 99 mg/dL   Total CHOL/HDL Ratio 3     Comment:                Men          Women1/2 Average Risk     3.4          3.3Average Risk          5.0  4.42X Average Risk          9.6          7.13X Average Risk          15.0          11.0                       NonHDL 140.26     Comment: NOTE:  Non-HDL goal should be 30 mg/dL higher than patient's LDL goal (i.e. LDL goal of < 70 mg/dL, would have non-HDL goal of < 100 mg/dL)  CBC w/Diff     Status: None   Collection Time: 06/02/21 10:25 AM  Result Value Ref Range   WBC 7.4 4.0 - 10.5 K/uL   RBC 4.90 3.87 - 5.11 Mil/uL   Hemoglobin 13.4 12.0 - 15.0 g/dL   HCT 41.7 36.0 - 46.0 %   MCV 85.1  78.0 - 100.0 fl   MCHC 32.2 30.0 - 36.0 g/dL   RDW 14.6 11.5 - 15.5 %   Platelets 304.0 150.0 - 400.0 K/uL   Neutrophils Relative % 60.8 43.0 - 77.0 %   Lymphocytes Relative 28.4 12.0 - 46.0 %   Monocytes Relative 5.4 3.0 - 12.0 %   Eosinophils Relative 4.6 0.0 - 5.0 %   Basophils Relative 0.8 0.0 - 3.0 %   Neutro Abs 4.5 1.4 - 7.7 K/uL   Lymphs Abs 2.1 0.7 - 4.0 K/uL   Monocytes Absolute 0.4 0.1 - 1.0 K/uL   Eosinophils Absolute 0.3 0.0 - 0.7 K/uL   Basophils Absolute 0.1 0.0 - 0.1 K/uL  TSH     Status: None   Collection Time: 06/02/21 10:25 AM  Result Value Ref Range   TSH 0.62 0.35 - 5.50 uIU/mL  Urinalysis, Routine w reflex microscopic     Status: None   Collection Time: 06/02/21 10:25 AM  Result Value Ref Range   Color, Urine YELLOW Yellow;Lt. Yellow;Straw;Dark Yellow;Amber;Green;Red;Brown   APPearance CLEAR Clear;Turbid;Slightly Cloudy;Cloudy   Specific Gravity, Urine 1.010 1.000 - 1.030   pH 7.0 5.0 - 8.0   Total Protein, Urine NEGATIVE Negative   Urine Glucose NEGATIVE Negative   Ketones, ur NEGATIVE Negative   Bilirubin Urine NEGATIVE Negative   Hgb urine dipstick NEGATIVE Negative   Urobilinogen, UA 0.2 0.0 - 1.0   Leukocytes,Ua NEGATIVE Negative   Nitrite NEGATIVE Negative   WBC, UA 0-2/hpf 0-2/hpf   RBC / HPF 0-2/hpf 0-2/hpf   Squamous Epithelial / LPF Rare(0-4/hpf) Rare(0-4/hpf)   Objective  Body mass index is 32.08 kg/m. Wt Readings from Last 3 Encounters:  06/02/21 190 lb (86.2 kg)  08/31/20 194 lb 6.4 oz (88.2 kg)  02/27/20 181 lb 12.8 oz (82.5 kg)   Temp Readings from Last 3 Encounters:  06/02/21 (!) 97.2 F (36.2 C) (Temporal)  08/31/20 (!) 97.4 F (36.3 C) (Oral)  02/27/20 98 F (36.7 C) (Oral)   BP Readings from Last 3 Encounters:  06/02/21 110/72  08/31/20 116/80  02/27/20 118/78   Pulse Readings from Last 3 Encounters:  06/02/21 72  08/31/20 (!) 57  02/27/20 77    Physical Exam Vitals and nursing note reviewed.  Constitutional:       Appearance: Normal appearance. She is well-developed and well-groomed.  HENT:     Head: Normocephalic and atraumatic.  Eyes:     Conjunctiva/sclera: Conjunctivae normal.     Pupils: Pupils are equal, round, and reactive to light.  Cardiovascular:  Rate and Rhythm: Normal rate and regular rhythm.     Heart sounds: Normal heart sounds. No murmur heard. Pulmonary:     Effort: Pulmonary effort is normal.     Breath sounds: Normal breath sounds.  Abdominal:     General: Abdomen is flat. Bowel sounds are normal.     Tenderness: There is no abdominal tenderness.  Musculoskeletal:        General: No tenderness.  Skin:    General: Skin is warm and dry.  Neurological:     General: No focal deficit present.     Mental Status: She is alert and oriented to person, place, and time. Mental status is at baseline.     Cranial Nerves: Cranial nerves 2-12 are intact.     Gait: Gait is intact.  Psychiatric:        Attention and Perception: Attention and perception normal.        Mood and Affect: Mood and affect normal.        Speech: Speech normal.        Behavior: Behavior normal. Behavior is cooperative.        Thought Content: Thought content normal.        Cognition and Memory: Cognition and memory normal.        Judgment: Judgment normal.    Assessment  Plan  Hyperlipidemia, unspecified hyperlipidemia type - Plan: Comprehensive metabolic panel, lipid Pravachol 10 mg 3x per week consider increase to 4x per week hld not controlled    Allergic rhinitis, unspecified seasonality, unspecified trigger - Plan: fluticasone (FLONASE) 50 MCG/ACT nasal spray, sodium chloride (OCEAN) 0.65 % SOLN nasal spray Xyzal consider  Nasal saline 2 sprays followed by flonase or nasocort   Overactive bladder - Plan: UA,Urine Culture R/o uti with hx  Fatigue  labs  Insomnia, unspecified type Melatonin 5-10 mg nature made  Tranquil sleep brand stress relax (melatonin, serotonin or L theanine)   Consider trazadone  Sleepy time tea chamomile   Hand arthritis Voltaren  Aspercream with lidocaine   HM Flu shot  02/07/21  Utd prevnar given today  Rx shingrix cost $45 Rx given rx utd Tdap due 09/03/23 Utd pna 23  4/4  pfizer covid 19 vaccines pna 23 vaccine utd   mammo 02/25/21 negative ordered   DEXA in 2015 DEXA osteopenia DEXA 01/31/19 osteopenia    Colonoscopy 11/20/13 hyperplastic polyps and anal lesion  -removal [facility] Dr. Mirian Mo  Seen 10/21/20 surgery, 11/08/20 and 11/29/20 anal lesion resolved as of 06/02/21   Pap 10/17/13 negative    hcv neg 09/03/11   Former smoker quit in 2016 smoked x 20 years max 1-2 ppd FH father lung cancer    Skin-no issues as of 01/08/19 not assessed 01/01/20    Ortho Dr. Marry Guan    Eye exam 03/2021   As of 02/27/20 Eye md Dr. Gloriann Loan resch after 02/2020 h/o floaters s/p cataracts better  Worse right + blurry right eyelid droppy   rec healthy diet and exercise    B12 1000 mcg qd and or mvd  D3 1000 mg qd  B6 qd otc taking   Provider: Dr. Olivia Mackie McLean-Scocuz[facility]

## 2021-06-03 LAB — URINE CULTURE
MICRO NUMBER:: 12892449
SPECIMEN QUALITY:: ADEQUATE

## 2021-06-07 ENCOUNTER — Other Ambulatory Visit: Payer: Self-pay | Admitting: Internal Medicine

## 2021-06-07 ENCOUNTER — Other Ambulatory Visit: Payer: Self-pay

## 2021-06-07 DIAGNOSIS — E785 Hyperlipidemia, unspecified: Secondary | ICD-10-CM

## 2021-06-07 MED ORDER — PRAVASTATIN SODIUM 20 MG PO TABS: 20.0000 mg | ORAL_TABLET | ORAL | 3 refills | Status: DC

## 2021-06-20 IMAGING — CR LUMBAR SPINE - COMPLETE 4+ VIEW
1 series · 5 of 5 positions shown · non-contrast
Comparison: None.

CLINICAL DATA: Pain with left-sided radicular symptoms

EXAM:
LUMBAR SPINE - COMPLETE 4+ VIEW

[Series 1: dg lumbar spine complete 4 +v · 0.14mm/px · 5 of 5 slices shown]
[im 1/5]
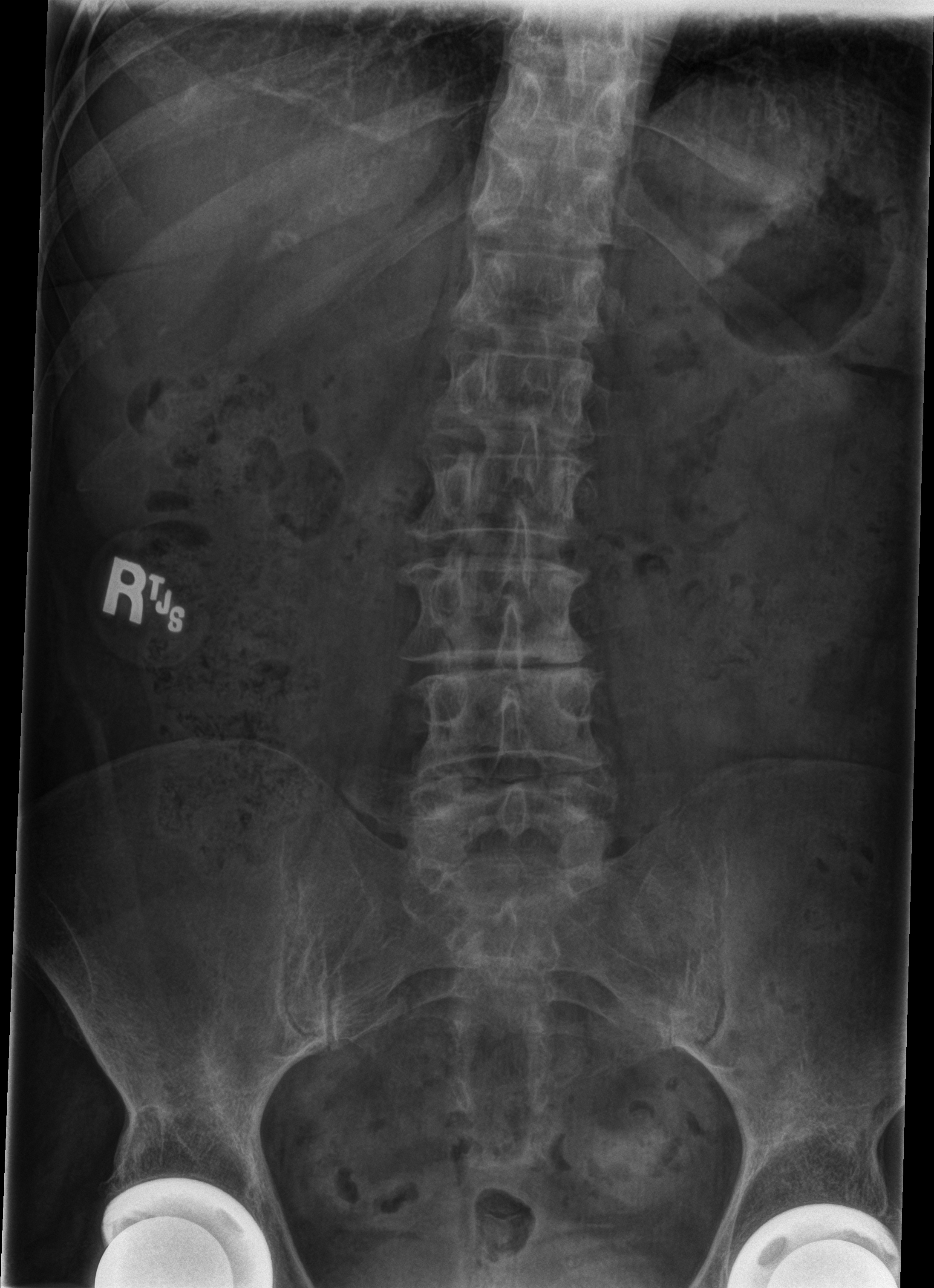
[im 2/5]
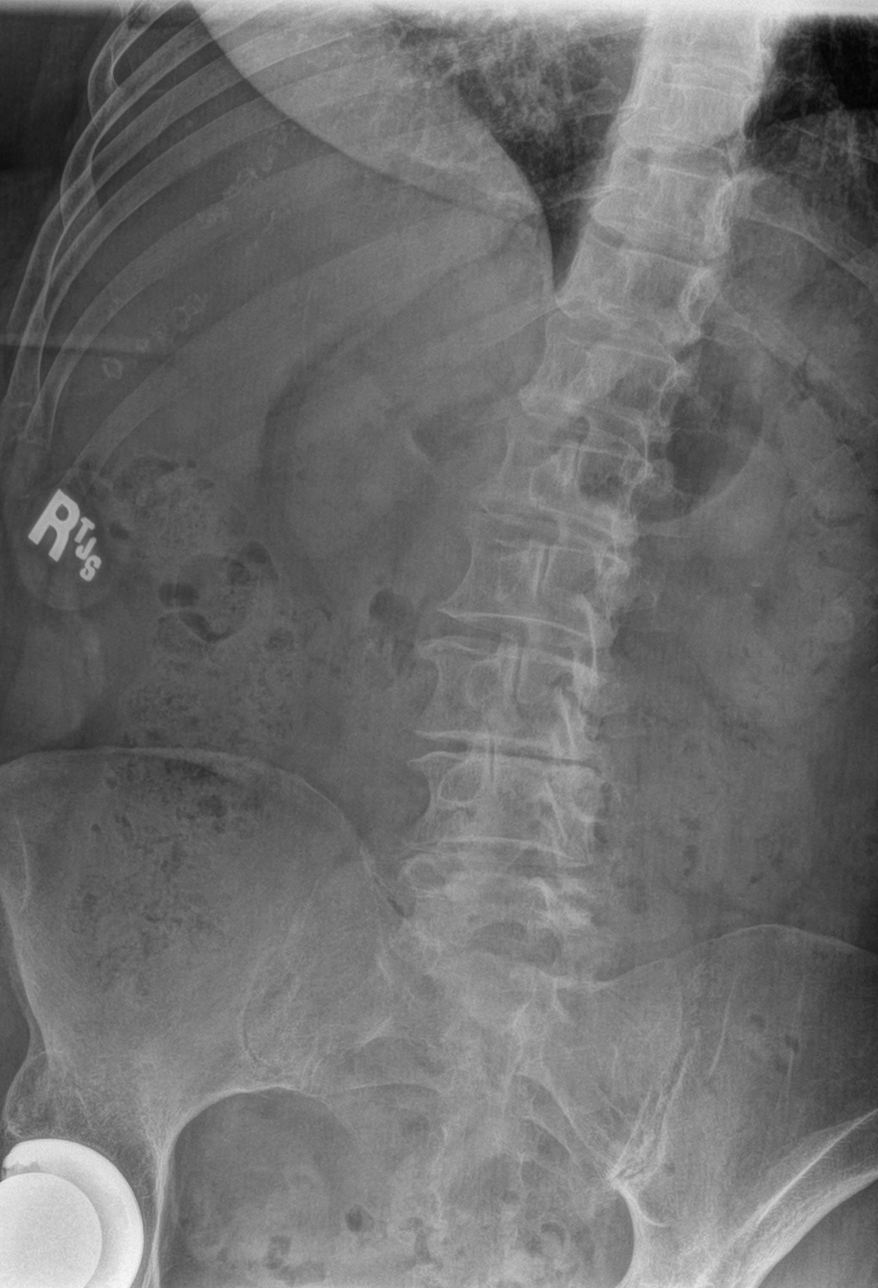
[im 3/5]
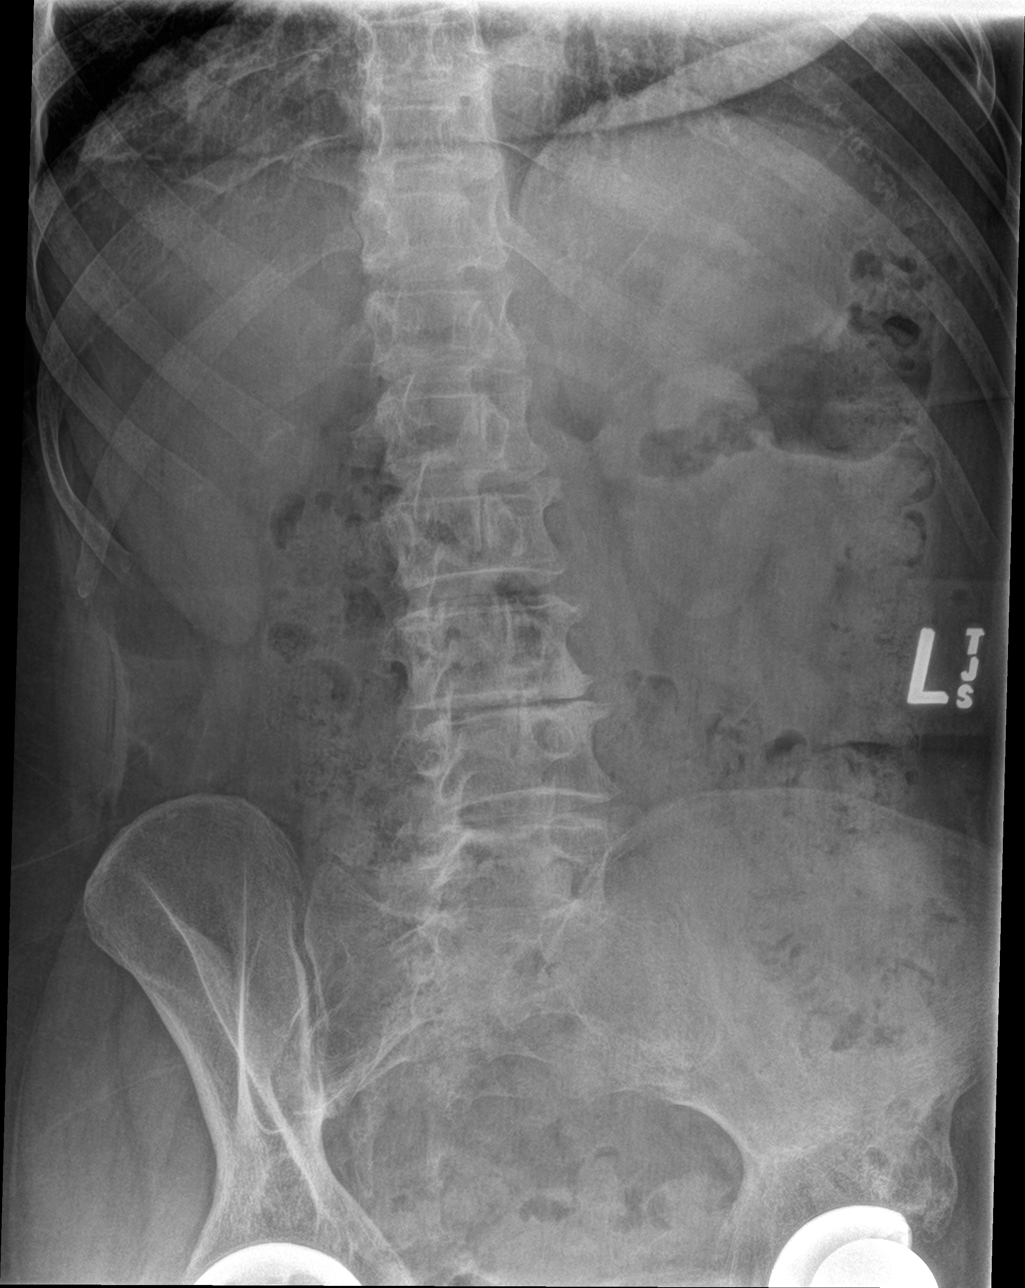
[im 4/5]
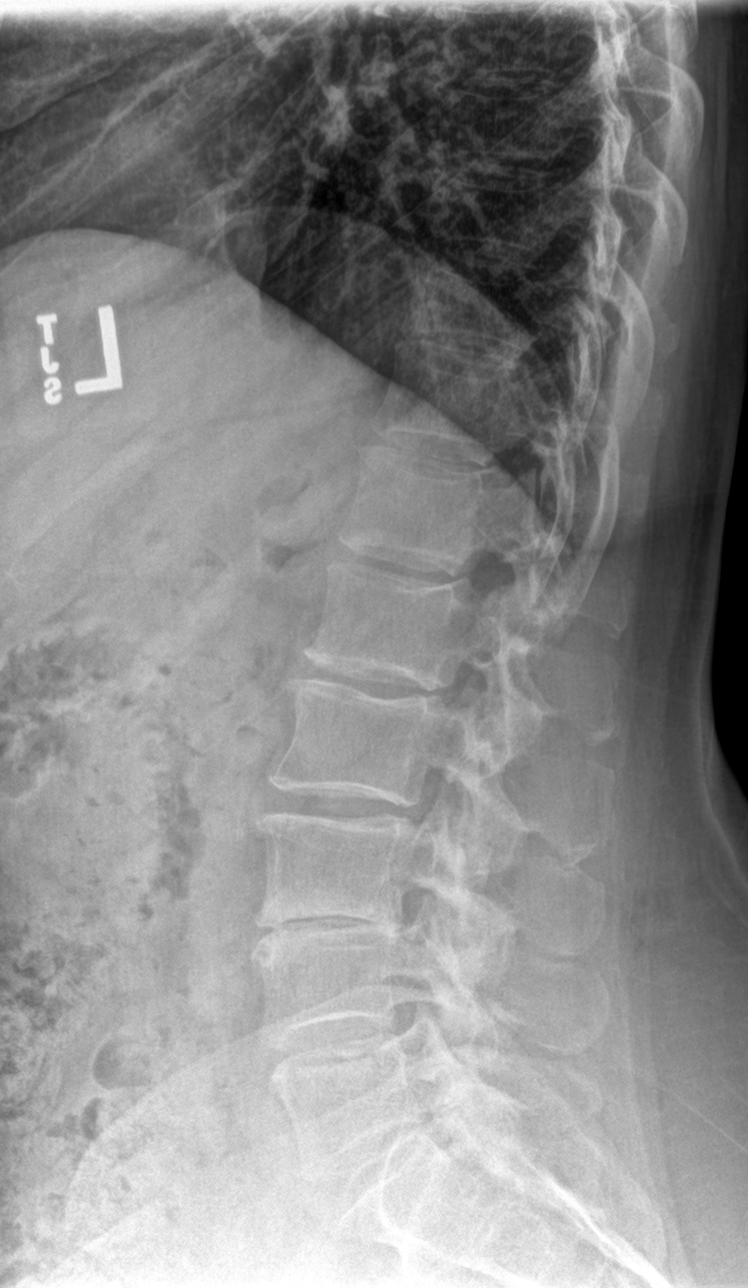
[im 5/5]
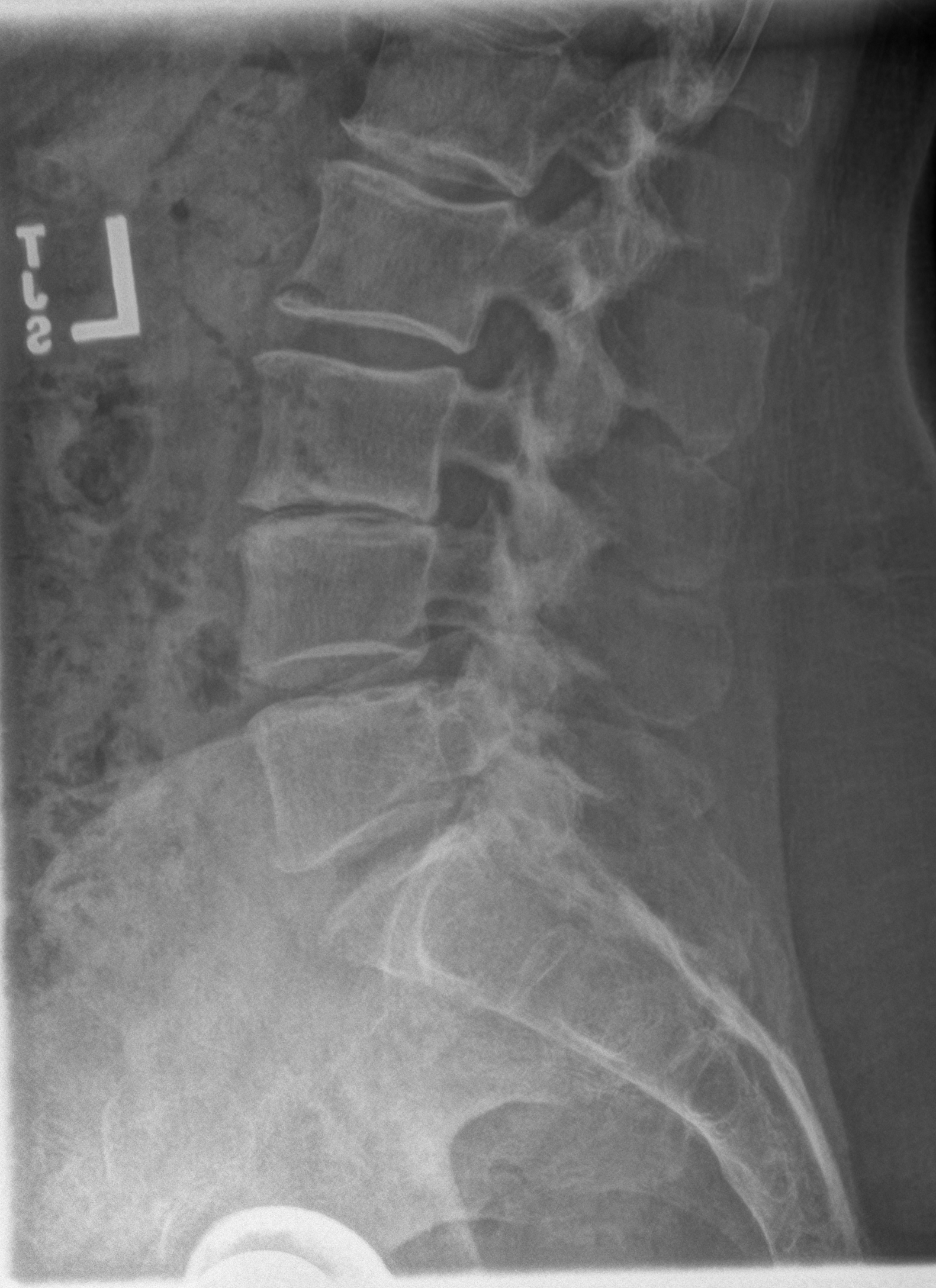

[5 of 5 positions shown; findings below may reference images not displayed]

FINDINGS: Frontal, lateral, spot lumbosacral lateral, and bilateral oblique
views were obtained. There are 5 non-rib-bearing lumbar type
vertebral bodies. T12 ribs are hypoplastic. There is mild lumbar
dextroscoliosis. There is no fracture or spondylolisthesis. There is
moderately severe disc space narrowing at L3-4. There is milder disc
space narrowing at L1-2 and L4-5. There is facet osteoarthritic
change at L4-5 and L5-S1 bilaterally. There are total hip
replacements bilaterally.
IMPRESSION: Scoliosis with osteoarthritic change, most notably at L3-4. No
fracture or spondylolisthesis. Total hip replacements bilaterally
noted.

## 2021-12-27 DIAGNOSIS — Z96643 Presence of artificial hip joint, bilateral: Secondary | ICD-10-CM | POA: Diagnosis not present

## 2022-04-17 DIAGNOSIS — H524 Presbyopia: Secondary | ICD-10-CM | POA: Diagnosis not present

## 2022-04-17 DIAGNOSIS — Z0100 Encounter for examination of eyes and vision without abnormal findings: Secondary | ICD-10-CM | POA: Diagnosis not present

## 2022-06-01 ENCOUNTER — Encounter: Payer: Medicare HMO | Admitting: Family Medicine

## 2022-06-02 ENCOUNTER — Ambulatory Visit: Payer: Medicare HMO | Admitting: Internal Medicine

## 2022-07-06 ENCOUNTER — Encounter: Payer: Self-pay | Admitting: Internal Medicine

## 2022-07-06 ENCOUNTER — Ambulatory Visit (INDEPENDENT_AMBULATORY_CARE_PROVIDER_SITE_OTHER): Payer: Medicare HMO | Admitting: Internal Medicine

## 2022-07-06 VITALS — BP 126/68 | HR 95 | Temp 97.8°F | Resp 18 | Ht 64.5 in | Wt 184.6 lb

## 2022-07-06 DIAGNOSIS — E559 Vitamin D deficiency, unspecified: Secondary | ICD-10-CM | POA: Diagnosis not present

## 2022-07-06 DIAGNOSIS — Z1231 Encounter for screening mammogram for malignant neoplasm of breast: Secondary | ICD-10-CM

## 2022-07-06 DIAGNOSIS — Z122 Encounter for screening for malignant neoplasm of respiratory organs: Secondary | ICD-10-CM | POA: Diagnosis not present

## 2022-07-06 DIAGNOSIS — E782 Mixed hyperlipidemia: Secondary | ICD-10-CM | POA: Diagnosis not present

## 2022-07-06 DIAGNOSIS — Z23 Encounter for immunization: Secondary | ICD-10-CM | POA: Diagnosis not present

## 2022-07-06 DIAGNOSIS — T7840XS Allergy, unspecified, sequela: Secondary | ICD-10-CM

## 2022-07-06 MED ORDER — PRAVASTATIN SODIUM 20 MG PO TABS: 20.0000 mg | ORAL_TABLET | ORAL | 2 refills | Status: DC

## 2022-07-06 NOTE — Patient Instructions (Addendum)
It was great seeing you today!  Plan discussed at today's visit: -Blood work ordered today, results will be uploaded to [facility].  -Medications refilled -Prevnar 20 vaccine today -Lung cancer screening   Follow up in: 2-3 weeks   Take care and let us know if you have any questions or concerns prior to your next visit.  Dr. Rosana Berger

## 2022-07-06 NOTE — Progress Notes (Signed)
New Patient Office Visit  Subjective    Patient ID: Shannon [name], female    DOB: November 10, 1950  Age: 72 y.o. MRN: [id number]  CC:  Chief Complaint  Patient presents with   Establish Care    Needs labs   Arthritis    Bilateral hand pain    HPI Shannon [name] presents to establish care.  HLD: -Medications: Pravastatin 20 mg every other day, aspirin  -Patient is compliant with above medications and reports no side effects.  -Last lipid panel: Lipid Panel     Component Value Date/Time   CHOL 210 (H) 06/02/2021 1025   CHOL 254 (H) 07/04/2019 1438   TRIG 71.0 06/02/2021 1025   HDL 69.40 06/02/2021 1025   HDL 70 07/04/2019 1438   CHOLHDL 3 06/02/2021 1025   VLDL 14.2 06/02/2021 1025   LDLCALC 126 (H) 06/02/2021 1025   LDLCALC 160 (H) 07/04/2019 1438   LABVLDL 24 07/04/2019 1438   Allergies: -Currently on Flonase, had been on Allegra but does not take every day. -Symptoms well-controlled  Health Maintenance: -Blood work due  -Mammogram 2022, due -Colonoscopy 2015, repeat in 2025 -Lung cancer screening due, patient is a former smoker quit in 2016 but is regularly exposed to secondhand smoke.   Outpatient Encounter Medications as of 07/06/2022  Medication Sig   acetaminophen (TYLENOL) 500 MG tablet Take 500 mg by mouth every 6 (six) hours as needed for mild pain.   Artificial Tear Solution (SOOTHE XP) SOLN Apply 1-2 drops to eye as needed (dry eye).   ASPIRIN 81 PO Take by mouth daily in the afternoon.   Cholecalciferol 25 MCG (1000 UT) capsule Take 1,000 Units by mouth at bedtime.   fexofenadine (ALLEGRA) 180 MG tablet Take 180 mg by mouth daily as needed for allergies or rhinitis.   fluticasone (FLONASE) 50 MCG/ACT nasal spray Place 2 sprays into both nostrils daily.   ibuprofen (ADVIL) 200 MG tablet Take 200 mg by mouth every 6 (six) hours as needed.   pravastatin (PRAVACHOL) 20 MG tablet Take 1 tablet (20 mg total) by mouth every other day. After 6 pm   sodium  chloride (OCEAN) 0.65 % SOLN nasal spray Place 2 sprays into both nostrils as needed for congestion.   [DISCONTINUED] Ketotifen Fumarate (EYE ITCH RELIEF OP) Place 1 drop into both eyes daily. (Patient not taking: Reported on 06/02/2021)   [DISCONTINUED] Multiple Vitamin (MULTIVITAMIN ADULT PO) Take by mouth.   [DISCONTINUED] vitamin B-12 (CYANOCOBALAMIN) 1000 MCG tablet Take 1,000 mcg by mouth daily.   No facility-administered encounter medications on file as of 07/06/2022.    Past Medical History:  Diagnosis Date   Allergic rhinitis    Anal fissure    child   Anxiety    Arthritis    Osteoarthritis mild DDD lumbar spine noted in 2018    COVID-19    05/2020   Headache    Hyperlipidemia    Stress    Trochanteric bursitis    Vitamin B12 deficiency    Vitamin D deficiency     Past Surgical History:  Procedure Laterality Date   ANAL FISSURE REPAIR     CATARACT EXTRACTION     b/l 10 and 04/2018   CERVICAL CERCLAGE      X 3   JOINT REPLACEMENT     TOTAL HIP ARTHROPLASTY Right 08/24/2014   Dr. Marry Guan, [place] Left 09/25/2016   Procedure: TOTAL HIP ARTHROPLASTY;  Surgeon: Dereck Leep, MD;  Location: [facility] ORS;  Service: Orthopedics;  Laterality: Left;    Family History  Problem Relation Age of Onset   Osteoporosis Mother    Arthritis/Rheumatoid Mother    Hyperlipidemia Mother    Hypertension Father    Lung cancer Father        Shannon [name] 69   Other Father        brain tumor   Hyperlipidemia Father    CAD Father        s/p cabg   Other Sister        brain tumor glioblastoma died as of May 20, 2019   Hypercalcemia Sister    Hyperlipidemia Sister    Gout Son    Other Son        cognitive d/o   Breast cancer Neg Hx     Social History   Socioeconomic History   Marital status: Divorced    Spouse name: Not on file   Number of children: Not on file   Years of education: Not on file   Highest education level: Not on file  Occupational History   Not  on file  Tobacco Use   Smoking status: Former    Packs/day: 1.00    Types: Cigarettes    Quit date: 08/13/2014    Years since quitting: 7.9   Smokeless tobacco: Never  Vaping Use   Vaping Use: Former  Substance and Sexual Activity   Alcohol use: No   Drug use: No   Sexual activity: Not Currently  Other Topics Concern   Not on file  Social History Narrative   Lives with long term boyfriend 60 years older than her   1 son special needs lives in own place 58 y.o    3 kids total all sons      [name] son DPR      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       Objective    BP 126/68   Pulse 95   Temp 97.8 F (36.6 C)   Resp 18   Ht 5' 4.5" (1.638 m)   Wt 184 lb 9.6 oz (83.7 kg)   SpO2 95%   BMI 31.20 kg/m   Physical Exam Constitutional:      Appearance: Normal appearance.  HENT:     Head: Normocephalic and atraumatic.  Eyes:     Conjunctiva/sclera: Conjunctivae normal.  Cardiovascular:     Rate and Rhythm: Normal rate and regular rhythm.  Pulmonary:     Effort: Pulmonary effort is normal.     Breath sounds: Normal breath sounds.  Skin:    General: Skin is warm and dry.  Neurological:     General: No focal deficit present.     Mental Status: She is alert. Mental status is at baseline.  Psychiatric:        Mood and Affect: Mood normal.        Behavior: Behavior normal.         Assessment & Plan:   1. Mixed hyperlipidemia: Obtain lipid panel and other yearly labs like CBC and CMP today.  Patient is currently on pravastatin 20  mg every other day.  She is also taking a baby aspirin.  - CBC w/Diff/Platelet - COMPLETE  METABOLIC PANEL WITH GFR - Lipid Profile - pravastatin (PRAVACHOL) 20 MG tablet; Take 1 tablet (20 mg total) by mouth every other day. After 6 pm  Dispense: 60 tablet; Refill: 2  2. Allergy, sequela: Continue Flonase, oral antihistamine as needed.  - CBC w/Diff/Platelet - COMPLETE METABOLIC PANEL WITH GFR  3. Vitamin D deficiency: Check vitamin D levels, is currently on an over-the-counter supplement approximately at 1000 IUs daily.  - Vitamin D (25 hydroxy)  4. Encounter for screening mammogram for malignant neoplasm of breast: Mammogram ordered.  - MM Digital Screening; Future  5. Vaccine for streptococcus pneumoniae and influenza: Prevnar 20 administered today.  - Pneumococcal conjugate vaccine 20-valent (Prevnar 20)  6. Screening for lung cancer: Referral to lung cancer screening placed.  - Ambulatory Referral Lung Cancer Screening [facility]   Return in 3 weeks (on 07/27/2022) for for acute visit .   Teodora Medici, DO

## 2022-07-07 LAB — COMPLETE METABOLIC PANEL WITH GFR
AG Ratio: 1.6 (calc) (ref 1.0–2.5)
ALT: 24 U/L (ref 6–29)
AST: 23 U/L (ref 10–35)
Albumin: 4.2 g/dL (ref 3.6–5.1)
Alkaline phosphatase (APISO): 93 U/L (ref 37–153)
BUN: 15 mg/dL (ref 7–25)
CO2: 26 mmol/L (ref 20–32)
Calcium: 10.1 mg/dL (ref 8.6–10.4)
Chloride: 105 mmol/L (ref 98–110)
Creat: 1.05 mg/dL (ref 0.50–1.05)
Globulin: 2.7 g/dL (calc) (ref 1.9–3.7)
Glucose, Bld: 82 mg/dL (ref 65–99)
Potassium: 4.5 mmol/L (ref 3.5–5.3)
Sodium: 141 mmol/L (ref 135–146)
Total Bilirubin: 0.4 mg/dL (ref 0.2–1.2)
Total Protein: 6.9 g/dL (ref 6.1–8.1)
eGFR: 58 mL/min/{1.73_m2} — ABNORMAL LOW (ref >=60)

## 2022-07-07 LAB — LIPID PANEL
Cholesterol: 212 mg/dL — ABNORMAL HIGH (ref <200)
HDL: 70 mg/dL (ref >=50)
LDL Cholesterol (Calc): 117 mg/dL (calc) — ABNORMAL HIGH
Non-HDL Cholesterol (Calc): 142 mg/dL (calc) — ABNORMAL HIGH (ref <130)
Total CHOL/HDL Ratio: 3 (calc) (ref <5.0)
Triglycerides: 137 mg/dL (ref <150)

## 2022-07-07 LAB — CBC WITH DIFFERENTIAL/PLATELET
Absolute Monocytes: 465 cells/uL (ref 200–950)
Basophils Absolute: 50 cells/uL (ref 0–200)
Basophils Relative: 0.6 %
Eosinophils Absolute: 208 cells/uL (ref 15–500)
Eosinophils Relative: 2.5 %
HCT: 41.1 % (ref 35.0–45.0)
Hemoglobin: 13.8 g/dL (ref 11.7–15.5)
Lymphs Abs: 2872 cells/uL (ref 850–3900)
MCH: 27.9 pg (ref 27.0–33.0)
MCHC: 33.6 g/dL (ref 32.0–36.0)
MCV: 83 fL (ref 80.0–100.0)
MPV: 9.1 fL (ref 7.5–12.5)
Monocytes Relative: 5.6 %
Neutro Abs: 4706 cells/uL (ref 1500–7800)
Neutrophils Relative %: 56.7 %
Platelets: 288 10*3/uL (ref 140–400)
RBC: 4.95 10*6/uL (ref 3.80–5.10)
RDW: 14.2 % (ref 11.0–15.0)
Total Lymphocyte: 34.6 %
WBC: 8.3 10*3/uL (ref 3.8–10.8)

## 2022-07-07 LAB — VITAMIN D 25 HYDROXY (VIT D DEFICIENCY, FRACTURES): Vit D, 25-Hydroxy: 38 ng/mL (ref 30–100)

## 2022-07-26 NOTE — Progress Notes (Unsigned)
Established Patient Office Visit  Subjective    Patient ID: Shannon [name], female    DOB: [birth year]  Age: 72 y.o. MRN: [id number]  CC:  No chief complaint on file.   HPI Shannon [name] presents to follow up.  HLD: -Medications: Pravastatin 20 mg every other day, aspirin  -Patient is compliant with above medications and reports no side effects.  -Last lipid panel: Lipid Panel     Component Value Date/Time   CHOL 212 (H) 07/06/2022 1443   CHOL 254 (H) 07/04/2019 1438   TRIG 137 07/06/2022 1443   HDL 70 07/06/2022 1443   HDL 70 07/04/2019 1438   CHOLHDL 3.0 07/06/2022 1443   VLDL 14.2 06/02/2021 1025   LDLCALC 117 (H) 07/06/2022 1443   LABVLDL 24 07/04/2019 1438   Allergies: -Currently on Flonase, had been on Allegra but does not take every day. -Symptoms well-controlled  Health Maintenance: -Blood work due  -Mammogram 2022, due -Colonoscopy 2015, repeat in 2025 -Lung cancer screening due, patient is a former smoker quit in 2016 but is regularly exposed to secondhand smoke.   Outpatient Encounter Medications as of 07/27/2022  Medication Sig   acetaminophen (TYLENOL) 500 MG tablet Take 500 mg by mouth every 6 (six) hours as needed for mild pain.   Artificial Tear Solution (SOOTHE XP) SOLN Apply 1-2 drops to eye as needed (dry eye).   ASPIRIN 81 PO Take by mouth daily in the afternoon.   Cholecalciferol 25 MCG (1000 UT) capsule Take 1,000 Units by mouth at bedtime.   fexofenadine (ALLEGRA) 180 MG tablet Take 180 mg by mouth daily as needed for allergies or rhinitis.   fluticasone (FLONASE) 50 MCG/ACT nasal spray Place 2 sprays into both nostrils daily.   ibuprofen (ADVIL) 200 MG tablet Take 200 mg by mouth every 6 (six) hours as needed.   pravastatin (PRAVACHOL) 20 MG tablet Take 1 tablet (20 mg total) by mouth every other day. After 6 pm   sodium chloride (OCEAN) 0.65 % SOLN nasal spray Place 2 sprays into both nostrils as needed for congestion.   No  facility-administered encounter medications on file as of 07/27/2022.    Past Medical History:  Diagnosis Date   Allergic rhinitis    Anal fissure    child   Anxiety    Arthritis    Osteoarthritis mild DDD lumbar spine noted in 2018    COVID-19    05/2020   Headache    Hyperlipidemia    Stress    Trochanteric bursitis    Vitamin B12 deficiency    Vitamin D deficiency     Past Surgical History:  Procedure Laterality Date   ANAL FISSURE REPAIR     CATARACT EXTRACTION     b/l 10 and 04/2018   CERVICAL CERCLAGE      X 3   JOINT REPLACEMENT     TOTAL HIP ARTHROPLASTY Right 08/24/2014   Dr. Marry Guan, [place] Left 09/25/2016   Procedure: TOTAL HIP ARTHROPLASTY;  Surgeon: Dereck Leep, MD;  Location: [facility] ORS;  Service: Orthopedics;  Laterality: Left;    Family History  Problem Relation Age of Onset   Osteoporosis Mother    Arthritis/Rheumatoid Mother    Hyperlipidemia Mother    Hypertension Father    Lung cancer Father        deid 63   Other Father        brain tumor   Hyperlipidemia Father    CAD  Father        s/p cabg   Other Sister        brain tumor glioblastoma died as of 06-09-19   Hypercalcemia Sister    Hyperlipidemia Sister    Gout Son    Other Son        cognitive d/o   Breast cancer Neg Hx     Social History   Socioeconomic History   Marital status: Divorced    Spouse name: Not on file   Number of children: Not on file   Years of education: Not on file   Highest education level: Not on file  Occupational History   Not on file  Tobacco Use   Smoking status: Former    Packs/day: 1.00    Types: Cigarettes    Quit date: 08/13/2014    Years since quitting: 7.9   Smokeless tobacco: Never  Vaping Use   Vaping Use: Former  Substance and Sexual Activity   Alcohol use: No   Drug use: No   Sexual activity: Not Currently  Other Topics Concern   Not on file  Social History Narrative   Lives with long term boyfriend 22  years older than her   66 son special needs lives in own place 78 y.o    3 kids total all sons      Shannon [name] son DPR      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       Objective    There were no vitals taken for this visit.  Physical Exam Constitutional:      Appearance: Normal appearance.  HENT:     Head: Normocephalic and atraumatic.  Eyes:     Conjunctiva/sclera: Conjunctivae normal.  Cardiovascular:     Rate and Rhythm: Normal rate and regular rhythm.  Pulmonary:     Effort: Pulmonary effort is normal.     Breath sounds: Normal breath sounds.  Skin:    General: Skin is warm and dry.  Neurological:     General: No focal deficit present.     Mental Status: She is alert. Mental status is at baseline.  Psychiatric:        Mood and Affect: Mood normal.        Behavior: Behavior normal.         Assessment & Plan:   1. Mixed hyperlipidemia: Obtain lipid panel and other yearly labs like CBC and CMP today.  Patient is currently on pravastatin 20 mg every other day.  She is also taking a baby aspirin.  - CBC w/Diff/Platelet - COMPLETE METABOLIC PANEL WITH GFR - Lipid Profile - pravastatin (PRAVACHOL) 20 MG tablet; Take 1 tablet (20 mg total) by mouth every other day. After 6 pm  Dispense: 60 tablet; Refill: 2  2. Allergy, sequela: Continue Flonase, oral antihistamine as needed.  - CBC w/Diff/Platelet - COMPLETE METABOLIC PANEL WITH GFR  3. Vitamin D deficiency: Check vitamin D levels, is currently on an over-the-counter supplement approximately at 1000 IUs daily.  - Vitamin D (25 hydroxy)  4. Encounter for screening mammogram for  malignant neoplasm of breast: Mammogram  ordered.  - MM Digital Screening; Future  5. Vaccine for streptococcus pneumoniae and influenza: Prevnar 20 administered today.  - Pneumococcal conjugate vaccine 20-valent (Prevnar 20)  6. Screening for lung cancer: Referral to lung cancer screening placed.  - Ambulatory Referral Lung Cancer Screening [facility]   No follow-ups on file.   Teodora Medici, DO

## 2022-07-27 ENCOUNTER — Ambulatory Visit (INDEPENDENT_AMBULATORY_CARE_PROVIDER_SITE_OTHER): Payer: Medicare HMO | Admitting: Internal Medicine

## 2022-07-27 ENCOUNTER — Encounter: Payer: Self-pay | Admitting: Internal Medicine

## 2022-07-27 VITALS — BP 124/64 | HR 91 | Temp 97.9°F | Resp 16 | Wt 186.1 lb

## 2022-07-27 DIAGNOSIS — R69 Illness, unspecified: Secondary | ICD-10-CM | POA: Diagnosis not present

## 2022-07-27 DIAGNOSIS — F331 Major depressive disorder, recurrent, moderate: Secondary | ICD-10-CM | POA: Diagnosis not present

## 2022-07-27 DIAGNOSIS — N814 Uterovaginal prolapse, unspecified: Secondary | ICD-10-CM | POA: Diagnosis not present

## 2022-07-27 DIAGNOSIS — Z122 Encounter for screening for malignant neoplasm of respiratory organs: Secondary | ICD-10-CM | POA: Diagnosis not present

## 2022-07-27 MED ORDER — ESCITALOPRAM OXALATE 5 MG PO TABS: 5.0000 mg | ORAL_TABLET | Freq: Every day | ORAL | 1 refills | Status: DC

## 2022-07-27 NOTE — Patient Instructions (Addendum)
It was great seeing you today!  Plan discussed at today's visit: -Referral to Gynecology placed -Referral to Psychology placed for counseling -Lexapro 5 mg started -New referral to lung cancer screening ordered  Follow up in: 6 weeks   Take care and let us know if you have any questions or concerns prior to your next visit.  Dr. Rosana Berger

## 2022-08-19 ENCOUNTER — Other Ambulatory Visit: Payer: Self-pay | Admitting: Internal Medicine

## 2022-08-19 DIAGNOSIS — F331 Major depressive disorder, recurrent, moderate: Secondary | ICD-10-CM

## 2022-08-21 NOTE — Telephone Encounter (Signed)
Dx code,[F33.1]   Requested Prescriptions  Pending Prescriptions Disp Refills   escitalopram (LEXAPRO) 5 MG tablet [Pharmacy Med Name: ESCITALOPRAM 5 MG TABLET] 90 tablet 0    Sig: TAKE 1 TABLET (5 MG TOTAL) BY MOUTH DAILY.     Psychiatry:  Antidepressants - SSRI Passed - 08/19/2022  1:32 PM      Passed - Completed PHQ-2 or PHQ-9 in the last 360 days      Passed - Valid encounter within last 6 months    Recent Outpatient Visits           3 weeks ago Moderate episode of recurrent major depressive disorder [facility])   [facility] Margarita Mail, DO   1 month ago Mixed hyperlipidemia   [facility] Margarita Mail, DO       Future Appointments             In 2 weeks Margarita Mail, DO [facility]

## 2022-08-31 ENCOUNTER — Encounter: Payer: Self-pay | Admitting: *Deleted

## 2022-09-04 ENCOUNTER — Encounter: Payer: Self-pay | Admitting: Obstetrics & Gynecology

## 2022-09-04 ENCOUNTER — Ambulatory Visit (INDEPENDENT_AMBULATORY_CARE_PROVIDER_SITE_OTHER): Payer: Medicare HMO | Admitting: Obstetrics & Gynecology

## 2022-09-04 VITALS — BP 133/81 | HR 69 | Ht 61.5 in | Wt 185.0 lb

## 2022-09-04 DIAGNOSIS — N813 Complete uterovaginal prolapse: Secondary | ICD-10-CM | POA: Insufficient documentation

## 2022-09-04 NOTE — Progress Notes (Signed)
    GYNECOLOGY PROGRESS NOTE  Subjective:    Patient ID: Shannon [name], female    DOB: 03/11/1951, 72 y.o.   MRN: [id number]  HPI  Patient is a 72 y.o. [id number]  who presents for several years' h/o pelvic organ prolapse. It has gotten progressively worse. As of recently, she has to push something back inside her vagina. Sometimes she has difficulty emptying her bladder. She will void, stand up, and realize that she still have to void more.   She is not sexually active. Her long time partner is 60 and has health issues.    Review of Systems Pertinent items are noted in HPI.   Objective:   Blood pressure 133/81, pulse 69, height 5' 1.5" (1.562 m), weight 185 lb (83.9 kg). Body mass index is 34.39 kg/m. General appearance: alert Abdomen: soft, non-tender; bowel sounds normal; no masses,  no organomegaly EG- moderate VVA, she has grade 2 cystocele, grade 3 uterine prolapse, prominent urethra No masses or tenderness with bimanual exam Extremities: extremities normal, atraumatic, no cyanosis or edema. She has had both hips replaced. Neurologic: Grossly normal   Assessment:   1. Cystocele with third degree uterine prolapse      Plan:   1. Cystocele with third degree uterine prolapse - We discussed pelvic PT, surgical management, and pessary use. She would like to try a pessary while considering surgical treatment. She used a diaphragm in the past for contraception.

## 2022-09-07 ENCOUNTER — Telehealth: Payer: Self-pay | Admitting: Internal Medicine

## 2022-09-07 NOTE — Progress Notes (Signed)
Established Patient Office Visit  Subjective    Patient ID: Shannon [name], female    DOB: 05/15/1951  Age: 72 y.o. MRN: [id number]  CC:  Chief Complaint  Patient presents with   Follow-up    HPI Shannon [name] presents for follow up on chronic medical conditions. She was seen by Gynecology and was diagnosed with cystocele and grade 3 uterine prolapse. Being fitted for pessary next week.  MDD: -Has a special needs son which is stressful and other difficult family dynamics  -Mood status: stable - moods slightly improved but notices she's not feeling emotions as strongly  -Current treatment: Started on Lexapro 5 mg at LOV, uncertain if she likes it. Noticed some brain fog when first starting it, that has improved but does feel more tired in the daytime. Taking medication at night.  -Symptom severity: moderate  Psychotherapy/counseling: no  Previous psychiatric medications: celexa, paxil, prozac, wellbutrin, and zoloft Depressed mood: yes Anxious mood: yes Anhedonia: no     09/08/2022    1:26 PM 09/08/2022    1:18 PM 07/27/2022    2:09 PM 07/06/2022    2:06 PM 06/02/2021   10:24 AM  Depression screen PHQ 2/9  Decreased Interest 1 1 1  0 0  Down, Depressed, Hopeless 1 1 2 2 1   PHQ - 2 Score 2 2 3 2 1   Altered sleeping 2  2 2 2   Tired, decreased energy 2  1 2 1   Change in appetite 0  1 0 0  Feeling bad or failure about yourself  0  2 1 1   Trouble concentrating 0  0 1 0  Moving slowly or fidgety/restless 0  0 0 0  Suicidal thoughts 0  0 0 0  PHQ-9 Score 6  9 8 5   Difficult doing work/chores Somewhat difficult   Somewhat difficult Not difficult at all   HLD: -Medications: Pravastatin 20 mg every other day, aspirin  -Patient is compliant with above medications and reports no side effects.  -Last lipid panel: Lipid Panel     Component Value Date/Time   CHOL 212 (H) 07/06/2022 1443   CHOL 254 (H) 07/04/2019 1438   TRIG 137 07/06/2022 1443   HDL 70 07/06/2022 1443   HDL  70 07/04/2019 1438   CHOLHDL 3.0 07/06/2022 1443   VLDL 14.2 06/02/2021 1025   LDLCALC 117 (H) 07/06/2022 1443   LABVLDL 24 07/04/2019 1438   Allergies: -Currently on Flonase, had been on Allegra but does not take every day. -Symptoms well-controlled  Health Maintenance: -Blood work UTD -Mammogram 2022, due -Colonoscopy 2015, repeat in 2025 -Lung cancer screening due, patient is a former smoker quit in 2016 but is regularly exposed to secondhand smoke.   Outpatient Encounter Medications as of 09/08/2022  Medication Sig   acetaminophen (TYLENOL) 500 MG tablet Take 500 mg by mouth every 6 (six) hours as needed for mild pain.   Artificial Tear Solution (SOOTHE XP) SOLN Apply 1-2 drops to eye as needed (dry eye).   ASPIRIN 81 PO Take by mouth daily in the afternoon.   Cholecalciferol 25 MCG (1000 UT) capsule Take 1,000 Units by mouth at bedtime.   cyanocobalamin (VITAMIN B12) 500 MCG tablet Take 500 mcg by mouth daily.   escitalopram (LEXAPRO) 5 MG tablet TAKE 1 TABLET (5 MG TOTAL) BY MOUTH DAILY.   fluticasone (FLONASE) 50 MCG/ACT nasal spray Place 2 sprays into both nostrils daily.   ibuprofen (ADVIL) 200 MG tablet Take 200 mg by mouth every  6 (six) hours as needed.   pravastatin (PRAVACHOL) 20 MG tablet Take 1 tablet (20 mg total) by mouth every other day. After 6 pm   sodium chloride (OCEAN) 0.65 % SOLN nasal spray Place 2 sprays into both nostrils as needed for congestion.   fexofenadine (ALLEGRA) 180 MG tablet Take 180 mg by mouth daily as needed for allergies or rhinitis. (Patient not taking: Reported on 09/04/2022)   No facility-administered encounter medications on file as of 09/08/2022.    Past Medical History:  Diagnosis Date   Allergic rhinitis    Anal fissure    child   Anxiety    Arthritis    Osteoarthritis mild DDD lumbar spine noted in 2018    COVID-19    05/2020   Headache    Hyperlipidemia    Stress    Trochanteric bursitis    Vitamin B12 deficiency     Vitamin D deficiency     Past Surgical History:  Procedure Laterality Date   ANAL FISSURE REPAIR     CATARACT EXTRACTION     b/l 10 and 04/2018   CERVICAL CERCLAGE      X 3   JOINT REPLACEMENT     TOTAL HIP ARTHROPLASTY Right 08/24/2014   Dr. Ernest Pine, [facility]   TOTAL HIP ARTHROPLASTY Left 09/25/2016   Procedure: TOTAL HIP ARTHROPLASTY;  Surgeon: Donato Heinz, MD;  Location: [facility] ORS;  Service: Orthopedics;  Laterality: Left;    Family History  Problem Relation Age of Onset   Osteoporosis Mother    Arthritis/Rheumatoid Mother    Hyperlipidemia Mother    Hypertension Father    Lung cancer Father        deid 36   Other Father        brain tumor   Hyperlipidemia Father    CAD Father        s/p cabg   Other Sister        brain tumor glioblastoma died as of 2019-06-01   Hypercalcemia Sister    Hyperlipidemia Sister    Gout Son    Other Son        cognitive d/o   Breast cancer Neg Hx     Social History   Socioeconomic History   Marital status: Divorced    Spouse name: Not on file   Number of children: Not on file   Years of education: Not on file   Highest education level: Not on file  Occupational History   Not on file  Tobacco Use   Smoking status: Former    Packs/day: 1    Types: Cigarettes    Quit date: 08/13/2014    Years since quitting: 8.0   Smokeless tobacco: Never  Vaping Use   Vaping Use: Former  Substance and Sexual Activity   Alcohol use: No   Drug use: No   Sexual activity: Not Currently  Other Topics Concern   Not on file  Social History Narrative   Lives with long term boyfriend 11 years older than her   1 son special needs lives in own place 32 y.o    3 kids total all sons      Shannon [name] son DPR      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 Negative for chills and fever.  Gastrointestinal:  Negative for abdominal pain, constipation and diarrhea.        Objective    BP 112/70   Pulse 81   Temp 98.3 F (36.8 C)   Resp 16   Ht 5' 4.5" (1.638 m)   Wt 185 lb 9.6 oz (84.2 kg)   SpO2 95%   BMI 31.37 kg/m   Physical Exam Exam conducted with a chaperone present.  Constitutional:      Appearance: Normal appearance.  HENT:     Head: Normocephalic and atraumatic.  Eyes:     Conjunctiva/sclera: Conjunctivae normal.  Cardiovascular:     Rate and Rhythm: Normal rate and regular rhythm.  Pulmonary:     Effort: Pulmonary effort is normal.     Breath sounds: Normal breath sounds.  Genitourinary:    Comments: External genitalia within normal limits.  Vaginal mucosa pink, moist, normal rugae.  Nonfriable cervix without lesions, no discharge or bleeding noted on speculum exam. Grade 2 uterine prolapse noted. Skin:    General: Skin is warm and dry.  Neurological:     General: No focal deficit present.     Mental Status: She is alert. Mental status is at baseline.  Psychiatric:        Mood and Affect: Mood normal.        Behavior: Behavior normal.         Assessment & Plan:   1. Moderate episode of recurrent major depressive disorder [facility]): Discussed how fatigue could be due to depression not being completely controlled. At this point I recommend increasing Lexapro to 10 mg and we will recheck in 4 weeks.  - escitalopram (LEXAPRO) 5 MG tablet; Take 2 tablets (10 mg total) by mouth daily.  Dispense: 90 tablet; Refill: 0   Return in 4 weeks (on 10/06/2022).   Margarita Mail, DO

## 2022-09-07 NOTE — Telephone Encounter (Signed)
Called patient to schedule Medicare Annual Wellness Visit (AWV). Left message for patient to call back and schedule Medicare Annual Wellness Visit (AWV).  AWV-I: 09/06/2021  Please schedule an appointment at any time with [facility].  If any questions, please contact me at [phone].  Thank you ,  Bernice Cicero Care Guide [facility] AWV TEAM Direct Dial: [phone]    

## 2022-09-08 ENCOUNTER — Ambulatory Visit (INDEPENDENT_AMBULATORY_CARE_PROVIDER_SITE_OTHER): Payer: Medicare HMO | Admitting: Internal Medicine

## 2022-09-08 ENCOUNTER — Encounter: Payer: Self-pay | Admitting: Internal Medicine

## 2022-09-08 VITALS — BP 112/70 | HR 81 | Temp 98.3°F | Resp 16 | Ht 64.5 in | Wt 185.6 lb

## 2022-09-08 DIAGNOSIS — F331 Major depressive disorder, recurrent, moderate: Secondary | ICD-10-CM

## 2022-09-08 NOTE — Patient Instructions (Addendum)
It was great seeing you today!  Please call to schedule with [place] [facility] for the (Lung cancer screening) [phone]  Plan discussed at today's visit: -Increase Lexapro to 10 mg at night   Follow up in: 4 weeks   Take care and let us know if you have any questions or concerns prior to your next visit.  Dr. Caralee Ates

## 2022-09-12 ENCOUNTER — Encounter: Payer: Self-pay | Admitting: Obstetrics & Gynecology

## 2022-09-12 ENCOUNTER — Ambulatory Visit (INDEPENDENT_AMBULATORY_CARE_PROVIDER_SITE_OTHER): Payer: Medicare HMO | Admitting: Obstetrics & Gynecology

## 2022-09-12 VITALS — BP 137/89 | HR 74 | Ht 64.5 in | Wt 185.8 lb

## 2022-09-12 DIAGNOSIS — Z4689 Encounter for fitting and adjustment of other specified devices: Secondary | ICD-10-CM | POA: Diagnosis not present

## 2022-09-12 DIAGNOSIS — N813 Complete uterovaginal prolapse: Secondary | ICD-10-CM | POA: Diagnosis not present

## 2022-09-12 NOTE — Progress Notes (Signed)
   Established Patient Office Visit  Subjective   Patient ID: Shannon [name], female    DOB: May 04, 1951  Age: 72 y.o. MRN: [id number]  No chief complaint on file.   HPI   72 yo single P3 here for pessary fitting. I met her on 09/04/2022 with the issue of pelvic organ prolapse. I diagnosed her with grade 3 uterine prolapse and cystocele. She has some urinary issues as well. She declines surgery at this time.  Objective:     There were no vitals taken for this visit.   Physical Exam   Well nourished, well hydrated White female, no apparent distress She is ambulating and conversing normally. I did a trial of various pessaries and the one that suited her needs best was a short Gelhorn, 51 mm (2 inches). She was able to remove and insert the pessary. It relieved her prolapse symptoms and did not cause any discomfort.  Assessment & Plan:   Problem List Items Addressed This Visit     Cystocele with third degree uterine prolapse - Primary   Other Visit Diagnoses     Encounter for fitting and adjustment of pessary           She will come back in a few months for a pessary check. In the meantime, she will plan to remove it at least weekly and with intercourse prn.   Allie Bossier, MD

## 2022-09-19 ENCOUNTER — Ambulatory Visit (INDEPENDENT_AMBULATORY_CARE_PROVIDER_SITE_OTHER): Payer: Medicare HMO | Admitting: Clinical

## 2022-09-19 DIAGNOSIS — F411 Generalized anxiety disorder: Secondary | ICD-10-CM | POA: Diagnosis not present
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
## 2022-09-19 NOTE — Progress Notes (Signed)
                Ozzie Remmers, LCSW 

## 2022-09-20 NOTE — Telephone Encounter (Signed)
Pt called in about med change request for escitalopram (LEXAPRO) 5 MG. Please call back

## 2022-09-27 ENCOUNTER — Ambulatory Visit: Payer: Medicare HMO | Admitting: Clinical

## 2022-09-27 DIAGNOSIS — F411 Generalized anxiety disorder: Secondary | ICD-10-CM

## 2022-09-27 NOTE — Progress Notes (Signed)
    [facility] Counselor/Therapist Progress Note  Patient ID: RAYAN ENGELKES, MRN: [id number]    Date: 09/27/22  Time Spent: 10:35  am - 11:26 am : 51 Minutes  Treatment Type: Individual Therapy.  Reported Symptoms: patient reported feeling sad and fatigue  Mental Status Exam: Appearance:  Well Groomed     Behavior: Appropriate  Motor: Normal  Speech/Language:  Clear and Coherent  Affect: Appropriate  Mood: sad  Thought process: normal  Thought content:   WNL  Sensory/Perceptual disturbances:   WNL  Orientation: oriented to person, place, and situation  Attention: Good  Concentration: Good  Memory: WNL  Fund of knowledge:  Good  Insight:   Good  Judgment:  Good  Impulse Control: Good   Risk Assessment: Danger to Self:  No Patient denied current suicidal ideation  Self-injurious Behavior: No Danger to Others: No Patient denied current homicidal ideation Duty to Warn:no Physical Aggression / Violence:No  Access to Firearms a concern: No  Gang Involvement:No   Subjective:  Patient reported she called her primary care provider to discuss her concerns regarding patient's current dosage of lexapro. Patient reported she decreased her dosage of lexapro. Patient reported she continues to experience fatigue.  Patient reported she is concerned about her son in [place]. Patient stated, "pensive" in response to current mood and stated, "I still feel a little sad". Patient reported her niece passed away in September 06, 2022. Patient inquired about anxiety and reported her sister experiences anxiety. Patient stated, "right now I think I'll stick with my PCP" in response to recommendation for medication management consultation. Patient stated, "I think it would be good" in response to recommendation for therapy.   Interventions: Motivational Interviewing. Clinician conducted session in person at clinician's office at [facility] Provided supportive therapy, active and  reflective listening, and validation as patient discussed her concerns regarding her son in [place], the recent loss of her niece, and patient's response. Clinician reviewed diagnosis and treatment recommendations. Provided psycho education related to diagnosis and treatment.   Collaboration of Care: Other not required at this time  Diagnosis:  Generalized anxiety disorder   Plan: Goals to be developed during follow up appointment on 10/18/22.           Doree Barthel, LCSW

## 2022-09-29 ENCOUNTER — Other Ambulatory Visit: Payer: Self-pay

## 2022-09-29 DIAGNOSIS — Z87891 Personal history of nicotine dependence: Secondary | ICD-10-CM

## 2022-09-29 DIAGNOSIS — Z122 Encounter for screening for malignant neoplasm of respiratory organs: Secondary | ICD-10-CM

## 2022-10-10 NOTE — Progress Notes (Unsigned)
Established Patient Office Visit  Subjective    Patient ID: Shannon [name], female    DOB: [birth year]  Age: 72 y.o. MRN: [id number]  CC:  No chief complaint on file.   HPI Shannon [name] presents for follow up on chronic medical conditions. She was seen by Gynecology and was diagnosed with cystocele and grade 3 uterine prolapse. Being fitted for pessary next week.  MDD: -Has a special needs son which is stressful and other difficult family dynamics  -Mood status: stable - moods slightly improved but notices she's not feeling emotions as strongly  -Current treatment: Increased Lexapro to 10 mg at LOV, uncertain if she likes it. Noticed some brain fog when first starting it, that has improved but does feel more tired in the daytime. Taking medication at night.  -Symptom severity: moderate  Psychotherapy/counseling: no  Previous psychiatric medications: celexa, paxil, prozac, wellbutrin, and zoloft Depressed mood: yes Anxious mood: yes Anhedonia: no     09/08/2022    1:26 PM 09/08/2022    1:18 PM 07/27/2022    2:09 PM 07/06/2022    2:06 PM 06/02/2021   10:24 AM  Depression screen PHQ 2/9  Decreased Interest 1 1 1  0 0  Down, Depressed, Hopeless 1 1 2 2 1   PHQ - 2 Score 2 2 3 2 1   Altered sleeping 2  2 2 2   Tired, decreased energy 2  1 2 1   Change in appetite 0  1 0 0  Feeling bad or failure about yourself  0  2 1 1   Trouble concentrating 0  0 1 0  Moving slowly or fidgety/restless 0  0 0 0  Suicidal thoughts 0  0 0 0  PHQ-9 Score 6  9 8 5   Difficult doing work/chores Somewhat difficult   Somewhat difficult Not difficult at all   HLD: -Medications: Pravastatin 20 mg every other day, aspirin  -Patient is compliant with above medications and reports no side effects.  -Last lipid panel: Lipid Panel     Component Value Date/Time   CHOL 212 (H) 07/06/2022 1443   CHOL 254 (H) 07/04/2019 1438   TRIG 137 07/06/2022 1443   HDL 70 07/06/2022 1443   HDL 70 07/04/2019 1438    CHOLHDL 3.0 07/06/2022 1443   VLDL 14.2 06/02/2021 1025   LDLCALC 117 (H) 07/06/2022 1443   LABVLDL 24 07/04/2019 1438   Allergies: -Currently on Flonase, had been on Allegra but does not take every day. -Symptoms well-controlled  Health Maintenance: -Blood work UTD -Mammogram 2022, due -Colonoscopy 2015, repeat in 2025 -Lung cancer screening due, patient is a former smoker quit in 2016 but is regularly exposed to secondhand smoke.   Outpatient Encounter Medications as of 10/11/2022  Medication Sig   acetaminophen (TYLENOL) 500 MG tablet Take 500 mg by mouth every 6 (six) hours as needed for mild pain.   Artificial Tear Solution (SOOTHE XP) SOLN Apply 1-2 drops to eye as needed (dry eye).   ASPIRIN 81 PO Take by mouth daily in the afternoon.   Cholecalciferol 25 MCG (1000 UT) capsule Take 1,000 Units by mouth at bedtime.   cyanocobalamin (VITAMIN B12) 500 MCG tablet Take 500 mcg by mouth daily.   escitalopram (LEXAPRO) 5 MG tablet Take 2 tablets (10 mg total) by mouth daily.   fexofenadine (ALLEGRA) 180 MG tablet Take 180 mg by mouth daily as needed for allergies or rhinitis.   fluticasone (FLONASE) 50 MCG/ACT nasal spray Place 2 sprays into both nostrils  daily.   ibuprofen (ADVIL) 200 MG tablet Take 200 mg by mouth every 6 (six) hours as needed.   pravastatin (PRAVACHOL) 20 MG tablet Take 1 tablet (20 mg total) by mouth every other day. After 6 pm   sodium chloride (OCEAN) 0.65 % SOLN nasal spray Place 2 sprays into both nostrils as needed for congestion.   No facility-administered encounter medications on file as of 10/11/2022.    Past Medical History:  Diagnosis Date   Allergic rhinitis    Anal fissure    child   Anxiety    Arthritis    Osteoarthritis mild DDD lumbar spine noted in 2018    COVID-19    05/2020   Headache    Hyperlipidemia    Stress    Trochanteric bursitis    Vitamin B12 deficiency    Vitamin D deficiency     Past Surgical History:  Procedure  Laterality Date   ANAL FISSURE REPAIR     CATARACT EXTRACTION     b/l 10 and 04/2018   CERVICAL CERCLAGE      X 3   JOINT REPLACEMENT     TOTAL HIP ARTHROPLASTY Right 08/24/2014   Dr. Ernest Pine, [facility]   TOTAL HIP ARTHROPLASTY Left 09/25/2016   Procedure: TOTAL HIP ARTHROPLASTY;  Surgeon: Donato Heinz, MD;  Location: [facility] ORS;  Service: Orthopedics;  Laterality: Left;    Family History  Problem Relation Age of Onset   Osteoporosis Mother    Arthritis/Rheumatoid Mother    Hyperlipidemia Mother    Hypertension Father    Lung cancer Father        deid 79   Other Father        brain tumor   Hyperlipidemia Father    CAD Father        s/p cabg   Other Sister        brain tumor glioblastoma died as of 20-May-2019   Hypercalcemia Sister    Hyperlipidemia Sister    Gout Son    Other Son        cognitive d/o   Breast cancer Neg Hx     Social History   Socioeconomic History   Marital status: Divorced    Spouse name: Not on file   Number of children: Not on file   Years of education: Not on file   Highest education level: Not on file  Occupational History   Not on file  Tobacco Use   Smoking status: Former    Packs/day: 1    Types: Cigarettes    Quit date: 08/13/2014    Years since quitting: 8.1   Smokeless tobacco: Never  Vaping Use   Vaping Use: Former  Substance and Sexual Activity   Alcohol use: No   Drug use: No   Sexual activity: Not Currently  Other Topics Concern   Not on file  Social History Narrative   Lives with long term boyfriend 11 years older than her   1 son special needs lives in own place 44 y.o    3 kids total all sons      Shannon [name] son DPR      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 Negative for chills and fever.  Gastrointestinal:  Negative for abdominal pain, constipation and diarrhea.        Objective    There were no vitals taken for this visit.  Physical Exam Exam conducted with a chaperone present.  Constitutional:      Appearance: Normal appearance.  HENT:     Head: Normocephalic and atraumatic.  Eyes:     Conjunctiva/sclera: Conjunctivae normal.  Cardiovascular:     Rate and Rhythm: Normal rate and regular rhythm.  Pulmonary:     Effort: Pulmonary effort is normal.     Breath sounds: Normal breath sounds.  Genitourinary:    Comments: External genitalia within normal limits.  Vaginal mucosa pink, moist, normal rugae.  Nonfriable cervix without lesions, no discharge or bleeding noted on speculum exam. Grade 2 uterine prolapse noted. Skin:    General: Skin is warm and dry.  Neurological:     General: No focal deficit present.     Mental Status: She is alert. Mental status is at baseline.  Psychiatric:        Mood and Affect: Mood normal.        Behavior: Behavior normal.         Assessment & Plan:   1. Moderate episode of recurrent major depressive disorder [facility]): Discussed how fatigue could be due to depression not being completely controlled. At this point I recommend increasing Lexapro to 10 mg and we will recheck in 4 weeks.  - escitalopram (LEXAPRO) 5 MG tablet; Take 2 tablets (10 mg total) by mouth daily.  Dispense: 90 tablet; Refill: 0   No follow-ups on file.   Margarita Mail, DO

## 2022-10-11 ENCOUNTER — Ambulatory Visit (INDEPENDENT_AMBULATORY_CARE_PROVIDER_SITE_OTHER): Payer: Medicare HMO | Admitting: Internal Medicine

## 2022-10-11 ENCOUNTER — Encounter: Payer: Self-pay | Admitting: Internal Medicine

## 2022-10-11 VITALS — BP 112/70 | HR 70 | Temp 98.0°F | Resp 16 | Ht 64.5 in | Wt 183.9 lb

## 2022-10-11 DIAGNOSIS — F331 Major depressive disorder, recurrent, moderate: Secondary | ICD-10-CM

## 2022-10-11 MED ORDER — BUPROPION HCL ER (XL) 150 MG PO TB24: 150.0000 mg | ORAL_TABLET | Freq: Every day | ORAL | 1 refills | Status: DC

## 2022-10-18 ENCOUNTER — Ambulatory Visit: Payer: Medicare HMO | Admitting: Clinical

## 2022-10-27 ENCOUNTER — Encounter: Payer: Self-pay | Admitting: Physician Assistant

## 2022-10-27 ENCOUNTER — Ambulatory Visit (INDEPENDENT_AMBULATORY_CARE_PROVIDER_SITE_OTHER): Payer: Medicare HMO | Admitting: Physician Assistant

## 2022-10-27 ENCOUNTER — Ambulatory Visit
Admission: RE | Admit: 2022-10-27 | Discharge: 2022-10-27 | Disposition: A | Discharge status: Home / Self Care | Payer: Medicare HMO | Source: Ambulatory Visit | Attending: Acute Care | Admitting: Acute Care

## 2022-10-27 DIAGNOSIS — Z87891 Personal history of nicotine dependence: Secondary | ICD-10-CM

## 2022-10-27 DIAGNOSIS — Z122 Encounter for screening for malignant neoplasm of respiratory organs: Secondary | ICD-10-CM

## 2022-10-27 NOTE — Progress Notes (Signed)
Virtual Visit via Telephone Note  I connected with Shannon [name] on 10/27/22 at 10:30 AM by telephone and verified that I am speaking with the correct person using two identifiers.  Location: Patient: home Provider: working virtually from home   I discussed the limitations, risks, security and privacy concerns of performing an evaluation and management service by telephone and the availability of in person appointments. I also discussed with the patient that there may be a patient responsible charge related to this service. The patient expressed understanding and agreed to proceed.       Shared Decision Making Visit Lung Cancer Screening Program [phone])   Eligibility: Age 72 Pack Years Smoking History Calculation 10 (# packs/per year x # years smoked) Recent History of coughing up blood  no Unexplained weight loss? No ( >Than 15 pounds within the last 6 months ) Prior History Lung / other cancer No (Diagnosis within the last 5 years already requiring surveillance chest CT Scans). Smoking Status Former Smoker Former Smokers: Years since quit: 8 years  Quit Date: 2016  Visit Components: Discussion included one or more decision making aids? Yes Discussion included risk/benefits of screening. Yes Discussion included potential follow up diagnostic testing for abnormal scans. Yes Discussion included meaning and risk of over diagnosis. Yes Discussion included meaning and risk of False Positives. Yes Discussion included meaning of total radiation exposure. Yes  Counseling Included: Importance of adherence to annual lung cancer LDCT screening. Yes Impact of comorbidities on ability to participate in the program. Yes Ability and willingness to under diagnostic treatment. Yes  Smoking Cessation Counseling: Former Smokers:  Discussed the importance of maintaining cigarette abstinence. Yes Diagnosis Code: Personal History of Nicotine Dependence. H08.657 Information about  tobacco cessation classes and interventions provided to patient. Yes Written Order for Lung Cancer Screening with LDCT placed in Epic. Yes (CT Chest Lung Cancer Screening Low Dose W/O CM) [id number] Z12.2-Screening of respiratory organs Z87.891-Personal history of nicotine dependence    I spent 25 minutes of face to face time/virtual visit time  with the patient discussing the risks and benefits of lung cancer screening. We took the time to pause at intervals to allow for questions to be asked and answered to ensure understanding. We discussed that they had taken the single most powerful action possible to decrease their risk of developing lung cancer when they quit smoking. I counseled them to remain smoke free, and to contact the office if they ever had the desire to smoke again so that we can provide resources and tools to help support the effort to remain smoke free. We discussed the time and location of the scan, and they  will receive a call or letter with the results within  24-72 hours of receiving them. They have the office contact information in the event they have questions.   They verbalized understanding of all of the above and had no further questions.    I explained to the patient that there has been a high incidence of coronary artery disease noted on these exams. I explained that this is a non-gated exam therefore degree or severity cannot be determined. This patient is on statin therapy. I have asked the patient to follow-up with their PCP regarding any incidental finding of coronary artery disease and management with diet or medication as they feel is clinically indicated. The patient verbalized understanding of the above and had no further questions.      Darcella Gasman Kelliann Pendergraph, PA-C

## 2022-10-27 NOTE — Patient Instructions (Signed)
Thank you for participating in the [facility]. It was our pleasure to meet you today. We will call you with the results of your scan within the next few days. Your scan will be assigned a Lung RADS category score by the physicians reading the scans.  This Lung RADS score determines follow up scanning.  See below for description of categories, and follow up screening recommendations. We will be in touch to schedule your follow up screening annually or based on recommendations of our providers. We will fax a copy of your scan results to your Primary Care Physician, or the physician who referred you to the program, to ensure they have the results. Please call the office if you have any questions or concerns regarding your scanning experience or results.  Our office number is [phone]. Please speak with Denise Phelps, RN. , or  Denise Buckner RN, They are  our Lung Cancer Screening RN.'s If They are unavailable when you call, Please leave a message on the voice mail. We will return your call at our earliest convenience.This voice mail is monitored several times a day.  Remember, if your scan is normal, we will scan you annually as long as you continue to meet the criteria for the program. (Age 50-80, Current smoker or smoker who has quit within the last 15 years). If you are a smoker, remember, quitting is the single most powerful action that you can take to decrease your risk of lung cancer and other pulmonary, breathing related problems. We know quitting is hard, and we are here to help.  Please let us know if there is anything we can do to help you meet your goal of quitting. If you are a former smoker, congratulations. We are proud of you! Remain smoke free! Remember you can refer friends or family members through the number above.  We will screen them to make sure they meet criteria for the program. Thank you for helping us take better care of you by  participating in Lung Screening.  You can receive free nicotine replacement therapy ( patches, gum or mints) by calling 1-800-QUIT NOW. Please call so we can get you on the path to becoming  a non-smoker. I know it is hard, but you can do this!  Lung RADS Categories:  Lung RADS 1: no nodules or definitely non-concerning nodules.  Recommendation is for a repeat annual scan in 12 months.  Lung RADS 2:  nodules that are non-concerning in appearance and behavior with a very low likelihood of becoming an active cancer. Recommendation is for a repeat annual scan in 12 months.  Lung RADS 3: nodules that are probably non-concerning , includes nodules with a low likelihood of becoming an active cancer.  Recommendation is for a 6-month repeat screening scan. Often noted after an upper respiratory illness. We will be in touch to make sure you have no questions, and to schedule your 6-month scan.  Lung RADS 4 A: nodules with concerning findings, recommendation is most often for a follow up scan in 3 months or additional testing based on our provider's assessment of the scan. We will be in touch to make sure you have no questions and to schedule the recommended 3 month follow up scan.  Lung RADS 4 B:  indicates findings that are concerning. We will be in touch with you to schedule additional diagnostic testing based on our provider's  assessment of the scan.  Other options for assistance in smoking cessation (   As covered by your insurance benefits)  Hypnosis for smoking cessation  [employer] [phone]  Acupuncture for smoking cessation  [facility] [phone]   

## 2022-11-02 ENCOUNTER — Other Ambulatory Visit: Payer: Self-pay | Admitting: Internal Medicine

## 2022-11-02 ENCOUNTER — Other Ambulatory Visit: Payer: Self-pay | Admitting: Acute Care

## 2022-11-02 DIAGNOSIS — F331 Major depressive disorder, recurrent, moderate: Secondary | ICD-10-CM

## 2022-11-02 DIAGNOSIS — Z122 Encounter for screening for malignant neoplasm of respiratory organs: Secondary | ICD-10-CM

## 2022-11-02 DIAGNOSIS — Z87891 Personal history of nicotine dependence: Secondary | ICD-10-CM

## 2022-11-02 NOTE — Telephone Encounter (Signed)
Requested Prescriptions  Refused Prescriptions Disp Refills   buPROPion (WELLBUTRIN XL) 150 MG 24 hr tablet [Pharmacy Med Name: BUPROPION HCL XL 150 MG TABLET] 90 tablet 1    Sig: TAKE 1 TABLET BY MOUTH EVERY DAY     Psychiatry: Antidepressants - bupropion Passed - 11/02/2022  2:33 PM      Passed - Cr in normal range and within 360 days    Creat  Date Value Ref Range Status  07/06/2022 1.05 0.50 - 1.05 mg/dL Final         Passed - AST in normal range and within 360 days    AST  Date Value Ref Range Status  07/06/2022 23 10 - 35 U/L Final         Passed - ALT in normal range and within 360 days    ALT  Date Value Ref Range Status  07/06/2022 24 6 - 29 U/L Final         Passed - Completed PHQ-2 or PHQ-9 in the last 360 days      Passed - Last BP in normal range    BP Readings from Last 1 Encounters:  10/11/22 112/70         Passed - Valid encounter within last 6 months    Recent Outpatient Visits           3 weeks ago Moderate episode of recurrent major depressive disorder [facility])   [facility] Margarita Mail, DO   1 month ago Moderate episode of recurrent major depressive disorder [facility])   [facility] Margarita Mail, DO   3 months ago Moderate episode of recurrent major depressive disorder [facility])   [facility] Margarita Mail, DO   3 months ago Mixed hyperlipidemia   [facility] Margarita Mail, DO       Future Appointments             In 1 week Margarita Mail, DO [facility], [facility]

## 2022-11-14 NOTE — Progress Notes (Unsigned)
Established Patient Office Visit  Subjective    Patient ID: Shannon [name], female    DOB: August 18, 1950  Age: 72 y.o. MRN: [id number]  CC:  No chief complaint on file.   HPI Shannon [name] presents for follow up on chronic medical conditions. She was seen by Gynecology and was diagnosed with cystocele and grade 3 uterine prolapse. She now has a pessary and is doing well with it.   MDD: -Has a special needs son which is stressful and other difficult family dynamics  -Mood status: stable  -Current treatment: Wellbutrin 150 mg XL started at [facility] -Failed Meds: Lexapro caused dizziness -Symptom severity: moderate  Psychotherapy/counseling: yes, new.  Previous psychiatric medications: celexa, paxil, prozac, wellbutrin, and zoloft Depressed mood: yes Anxious mood: yes Anhedonia: no     10/11/2022   10:30 AM 09/08/2022    1:26 PM 09/08/2022    1:18 PM 07/27/2022    2:09 PM 07/06/2022    2:06 PM  Depression screen PHQ 2/9  Decreased Interest 1 1 1 1  0  Down, Depressed, Hopeless 1 1 1 2 2   PHQ - 2 Score 2 2 2 3 2   Altered sleeping  2  2 2   Tired, decreased energy  2  1 2   Change in appetite  0  1 0  Feeling bad or failure about yourself   0  2 1  Trouble concentrating  0  0 1  Moving slowly or fidgety/restless  0  0 0  Suicidal thoughts  0  0 0  PHQ-9 Score  6  9 8   Difficult doing work/chores  Somewhat difficult   Somewhat difficult   HLD: -Medications: Pravastatin 20 mg every other day, aspirin  -Patient is compliant with above medications and reports no side effects.  -Last lipid panel: Lipid Panel     Component Value Date/Time   CHOL 212 (H) 07/06/2022 1443   CHOL 254 (H) 07/04/2019 1438   TRIG 137 07/06/2022 1443   HDL 70 07/06/2022 1443   HDL 70 07/04/2019 1438   CHOLHDL 3.0 07/06/2022 1443   VLDL 14.2 06/02/2021 1025   LDLCALC 117 (H) 07/06/2022 1443   LABVLDL 24 07/04/2019 1438   Allergies: -Currently on Flonase, had been on Allegra but does not take every  day. -Symptoms well-controlled  Health Maintenance: -Blood work UTD -Mammogram 02/2021, due -Colonoscopy 2015, repeat in 2025 -Lung cancer screening due, patient is a former smoker quit in 2016 but is regularly exposed to secondhand smoke.   Outpatient Encounter Medications as of 11/15/2022  Medication Sig   acetaminophen (TYLENOL) 500 MG tablet Take 500 mg by mouth every 6 (six) hours as needed for mild pain.   Artificial Tear Solution (SOOTHE XP) SOLN Apply 1-2 drops to eye as needed (dry eye).   ASPIRIN 81 PO Take by mouth daily in the afternoon.   buPROPion (WELLBUTRIN XL) 150 MG 24 hr tablet Take 1 tablet (150 mg total) by mouth daily.   Cholecalciferol 25 MCG (1000 UT) capsule Take 1,000 Units by mouth at bedtime.   cyanocobalamin (VITAMIN B12) 500 MCG tablet Take 500 mcg by mouth daily.   escitalopram (LEXAPRO) 5 MG tablet Take 2 tablets (10 mg total) by mouth daily.   fexofenadine (ALLEGRA) 180 MG tablet Take 180 mg by mouth daily as needed for allergies or rhinitis.   fluticasone (FLONASE) 50 MCG/ACT nasal spray Place 2 sprays into both nostrils daily.   ibuprofen (ADVIL) 200 MG tablet Take 200 mg by mouth  every 6 (six) hours as needed.   pravastatin (PRAVACHOL) 20 MG tablet Take 1 tablet (20 mg total) by mouth every other day. After 6 pm   sodium chloride (OCEAN) 0.65 % SOLN nasal spray Place 2 sprays into both nostrils as needed for congestion.   No facility-administered encounter medications on file as of 11/15/2022.    Past Medical History:  Diagnosis Date   Allergic rhinitis    Anal fissure    child   Anxiety    Arthritis    Osteoarthritis mild DDD lumbar spine noted in 2018    COVID-19    05/2020   Headache    Hyperlipidemia    Stress    Trochanteric bursitis    Vitamin B12 deficiency    Vitamin D deficiency     Past Surgical History:  Procedure Laterality Date   ANAL FISSURE REPAIR     CATARACT EXTRACTION     b/l 10 and 04/2018   CERVICAL CERCLAGE      X  3   JOINT REPLACEMENT     TOTAL HIP ARTHROPLASTY Right 08/24/2014   Dr. Ernest Pine, [facility]   TOTAL HIP ARTHROPLASTY Left 09/25/2016   Procedure: TOTAL HIP ARTHROPLASTY;  Surgeon: Donato Heinz, MD;  Location: [facility] ORS;  Service: Orthopedics;  Laterality: Left;    Family History  Problem Relation Age of Onset   Osteoporosis Mother    Arthritis/Rheumatoid Mother    Hyperlipidemia Mother    Hypertension Father    Lung cancer Father        deid 7   Other Father        brain tumor   Hyperlipidemia Father    CAD Father        s/p cabg   Other Sister        brain tumor glioblastoma died as of 2019-06-09   Hypercalcemia Sister    Hyperlipidemia Sister    Gout Son    Other Son        cognitive d/o   Breast cancer Neg Hx     Social History   Socioeconomic History   Marital status: Divorced    Spouse name: Not on file   Number of children: Not on file   Years of education: Not on file   Highest education level: Some college, no degree  Occupational History   Not on file  Tobacco Use   Smoking status: Former    Packs/day: 1.50    Years: 45.00    Additional pack years: 0.00    Total pack years: 67.50    Types: Cigarettes    Quit date: 08/13/2014    Years since quitting: 8.2   Smokeless tobacco: Never  Vaping Use   Vaping Use: Former  Substance and Sexual Activity   Alcohol use: No   Drug use: No   Sexual activity: Not Currently  Other Topics Concern   Not on file  Social History Narrative   Lives with long term boyfriend 11 years older than her   1 son special needs lives in own place 69 y.o    3 kids total all sons      Tommi Gilroy son DPR      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   Social Determinants of Health   Financial Resource Strain: Medium Risk (11/13/2022)   Overall Financial Resource Strain (CARDIA)    Difficulty of Paying Living Expenses: Somewhat hard  Food Insecurity: No Food Insecurity (11/13/2022)   Hunger Vital Sign  Worried About Programme researcher, broadcasting/film/video in  the Last Year: Never true    Ran Out of Food in the Last Year: Never true  Transportation Needs: No Transportation Needs (11/13/2022)   PRAPARE - Administrator, Civil Service (Medical): No    Lack of Transportation (Non-Medical): No  Physical Activity: Sufficiently Active (11/13/2022)   Exercise Vital Sign    Days of Exercise per Week: 5 days    Minutes of Exercise per Session: 30 min  Stress: No Stress Concern Present (11/13/2022)   [employer] of Occupational Health - Occupational Stress Questionnaire    Feeling of Stress : Only a little  Social Connections: Moderately Isolated (11/13/2022)   Social Connection and Isolation Panel [NHANES]    Frequency of Communication with Friends and Family: Once a week    Frequency of Social Gatherings with Friends and Family: Once a week    Attends Religious Services: 1 to 4 times per year    Active Member of [employer] or Organizations: No    Attends Engineer, structural: Not on file    Marital Status: Living with partner  Intimate Partner Violence: Not on file    Review of Systems  Constitutional:  Negative for chills and fever.  Gastrointestinal:  Negative for abdominal pain, constipation and diarrhea.        Objective    There were no vitals taken for this visit.  Physical Exam Constitutional:      Appearance: Normal appearance.  HENT:     Head: Normocephalic and atraumatic.  Eyes:     Conjunctiva/sclera: Conjunctivae normal.  Cardiovascular:     Rate and Rhythm: Normal rate and regular rhythm.  Pulmonary:     Effort: Pulmonary effort is normal.     Breath sounds: Normal breath sounds.  Skin:    General: Skin is warm and dry.  Neurological:     General: No focal deficit present.     Mental Status: She is alert. Mental status is at baseline.  Psychiatric:        Mood and Affect: Mood normal.        Behavior: Behavior normal.         Assessment & Plan:   1. Moderate episode of recurrent major  depressive disorder [facility]): Stop Lexapro due to side effects, start Wellbutrin XL 150 mg daily. Did take Zoloft and Wellbutrin at the same time a few years ago and one caused itching, will have Benadryl at home in case she has this again. Follow up in 4 weeks to recheck.   - buPROPion (WELLBUTRIN XL) 150 MG 24 hr tablet; Take 1 tablet (150 mg total) by mouth daily.  Dispense: 30 tablet; Refill: 1   No follow-ups on file.   Margarita Mail, DO

## 2022-11-15 ENCOUNTER — Encounter: Payer: Self-pay | Admitting: Internal Medicine

## 2022-11-15 ENCOUNTER — Ambulatory Visit (INDEPENDENT_AMBULATORY_CARE_PROVIDER_SITE_OTHER): Payer: Medicare HMO | Admitting: Internal Medicine

## 2022-11-15 VITALS — BP 130/82 | HR 73 | Temp 98.0°F | Resp 18 | Ht 64.5 in | Wt 184.3 lb

## 2022-11-15 DIAGNOSIS — J439 Emphysema, unspecified: Secondary | ICD-10-CM | POA: Diagnosis not present

## 2022-11-15 DIAGNOSIS — F331 Major depressive disorder, recurrent, moderate: Secondary | ICD-10-CM | POA: Diagnosis not present

## 2022-11-15 DIAGNOSIS — Z1231 Encounter for screening mammogram for malignant neoplasm of breast: Secondary | ICD-10-CM | POA: Diagnosis not present

## 2022-11-15 MED ORDER — ALBUTEROL SULFATE HFA 108 (90 BASE) MCG/ACT IN AERS
2.0000 | INHALATION_SPRAY | Freq: Four times a day (QID) | RESPIRATORY_TRACT | 2 refills | Status: AC | PRN
Start: 2022-11-15 — End: ?

## 2022-11-15 MED ORDER — BUPROPION HCL ER (XL) 150 MG PO TB24: 150.0000 mg | ORAL_TABLET | Freq: Every day | ORAL | 1 refills | Status: DC

## 2022-11-19 ENCOUNTER — Other Ambulatory Visit: Payer: Self-pay | Admitting: Internal Medicine

## 2022-11-19 DIAGNOSIS — F331 Major depressive disorder, recurrent, moderate: Secondary | ICD-10-CM

## 2022-11-20 NOTE — Telephone Encounter (Signed)
Requested Prescriptions  Refused Prescriptions Disp Refills   escitalopram (LEXAPRO) 5 MG tablet [Pharmacy Med Name: ESCITALOPRAM 5 MG TABLET] 90 tablet 0    Sig: TAKE 1 TABLET (5 MG TOTAL) BY MOUTH DAILY.     Psychiatry:  Antidepressants - SSRI Passed - 11/19/2022  8:50 AM      Passed - Completed PHQ-2 or PHQ-9 in the last 360 days      Passed - Valid encounter within last 6 months    Recent Outpatient Visits           5 days ago Moderate episode of recurrent major depressive disorder [facility])   [place] [facility] Margarita Mail, DO   1 month ago Moderate episode of recurrent major depressive disorder [facility])   [place] [facility] Margarita Mail, DO   2 months ago Moderate episode of recurrent major depressive disorder [facility])   [facility] Margarita Mail, DO   3 months ago Moderate episode of recurrent major depressive disorder [facility])   [facility] Margarita Mail, DO   4 months ago Mixed hyperlipidemia   [facility] Margarita Mail, DO       Future Appointments             In 5 months Margarita Mail, DO [facility], [facility]

## 2022-12-25 ENCOUNTER — Ambulatory Visit (INDEPENDENT_AMBULATORY_CARE_PROVIDER_SITE_OTHER): Payer: Medicare HMO | Admitting: Obstetrics & Gynecology

## 2022-12-25 ENCOUNTER — Encounter: Payer: Self-pay | Admitting: Obstetrics & Gynecology

## 2022-12-25 VITALS — BP 123/64 | Ht 64.5 in | Wt 184.0 lb

## 2022-12-25 DIAGNOSIS — Z4689 Encounter for fitting and adjustment of other specified devices: Secondary | ICD-10-CM

## 2022-12-25 DIAGNOSIS — N393 Stress incontinence (female) (male): Secondary | ICD-10-CM | POA: Insufficient documentation

## 2022-12-25 DIAGNOSIS — N813 Complete uterovaginal prolapse: Secondary | ICD-10-CM | POA: Diagnosis not present

## 2022-12-25 NOTE — Progress Notes (Signed)
    GYNECOLOGY PROGRESS NOTE  Subjective:    Patient ID: Shannon [name], female    DOB: [birth year], 71 y.o.   MRN: [id number]  HPI  Patient is a 72 y.o. [id number] monogamous P3 here for a 4 month pessary check. She started using a 2 inch Gelhorn 08/2022 for symptomatic uterine prolapse. She has has GSUI. The pessary had make a great improvement in the prolapse and a mild improvement in the GSUI. She is not ready to discuss surgery for the incontinence at this time. She is removing and cleaning the pessary at least weekly without difficulty.  She is abstinent due to her partner's health issues.  The following portions of the patient's history were reviewed and updated as appropriate: allergies, current medications, past family history, past medical history, past social history, past surgical history, and problem list.  Review of Systems Pertinent items are noted in HPI.   Objective:   Blood pressure 123/64, height 5' 4.5" (1.638 m), weight 184 lb (83.5 kg). Body mass index is 31.1 kg/m. General appearance: alert Abdomen: soft, non-tender; bowel sounds normal; no masses,  no organomegaly Pelvic: EG, Vagina, cervix - all appear normal. There are no excoriations with speculum exam. Extremities: extremities normal, atraumatic, no cyanosis or edema Neurologic: Grossly normal   Assessment:   1. Cystocele with third degree uterine prolapse      Plan:   1. Cystocele with third degree uterine prolapse - Continue pessary - yearly exam or prn sooner

## 2022-12-28 DIAGNOSIS — M7050 Other bursitis of knee, unspecified knee: Secondary | ICD-10-CM | POA: Diagnosis not present

## 2022-12-28 DIAGNOSIS — Z96643 Presence of artificial hip joint, bilateral: Secondary | ICD-10-CM | POA: Diagnosis not present

## 2023-01-04 ENCOUNTER — Encounter: Payer: Self-pay | Admitting: Internal Medicine

## 2023-01-07 ENCOUNTER — Other Ambulatory Visit: Payer: Self-pay | Admitting: Internal Medicine

## 2023-01-07 DIAGNOSIS — F331 Major depressive disorder, recurrent, moderate: Secondary | ICD-10-CM

## 2023-01-07 MED ORDER — BUPROPION HCL ER (SR) 100 MG PO TB12
100.0000 mg | ORAL_TABLET | Freq: Two times a day (BID) | ORAL | 1 refills | Status: AC
Start: 2023-01-07 — End: ?

## 2023-02-14 ENCOUNTER — Other Ambulatory Visit: Payer: Self-pay | Admitting: Internal Medicine

## 2023-02-14 DIAGNOSIS — F331 Major depressive disorder, recurrent, moderate: Secondary | ICD-10-CM

## 2023-02-15 NOTE — Telephone Encounter (Signed)
Requested medication (s) are due for refill today: No  Requested medication (s) are on the active medication list: Yes  Last refill:  01/07/23  Future visit scheduled: Yes  Notes to clinic:  Pharmacy requests 90 day supply and diagnosis code.    Requested Prescriptions  Pending Prescriptions Disp Refills   buPROPion ER (WELLBUTRIN SR) 100 MG 12 hr tablet [Pharmacy Med Name: BUPROPION HCL SR 100 MG TABLET] 180 tablet 1    Sig: TAKE 1 TABLET BY MOUTH TWICE A DAY     Psychiatry: Antidepressants - bupropion Passed - 02/14/2023 10:30 AM      Passed - Cr in normal range and within 360 days    Creat  Date Value Ref Range Status  07/06/2022 1.05 0.50 - 1.05 mg/dL Final         Passed - AST in normal range and within 360 days    AST  Date Value Ref Range Status  07/06/2022 23 10 - 35 U/L Final         Passed - ALT in normal range and within 360 days    ALT  Date Value Ref Range Status  07/06/2022 24 6 - 29 U/L Final         Passed - Completed PHQ-2 or PHQ-9 in the last 360 days      Passed - Last BP in normal range    BP Readings from Last 1 Encounters:  12/25/22 123/64         Passed - Valid encounter within last 6 months    Recent Outpatient Visits           3 months ago Moderate episode of recurrent major depressive disorder [facility])   [place] [facility] Margarita Mail, DO   4 months ago Moderate episode of recurrent major depressive disorder [facility])   [facility] Margarita Mail, DO   5 months ago Moderate episode of recurrent major depressive disorder [facility])   [facility] Margarita Mail, DO   6 months ago Moderate episode of recurrent major depressive disorder [facility])   [facility] Margarita Mail, DO   7 months ago Mixed hyperlipidemia   [facility] Margarita Mail, DO       Future Appointments             In 3 months  Margarita Mail, DO [facility], [facility]

## 2023-02-27 ENCOUNTER — Ambulatory Visit
Admission: RE | Admit: 2023-02-27 | Discharge: 2023-02-27 | Disposition: A | Discharge status: Home / Self Care | Payer: Medicare HMO | Source: Ambulatory Visit | Attending: Internal Medicine | Admitting: Internal Medicine

## 2023-02-27 DIAGNOSIS — Z1231 Encounter for screening mammogram for malignant neoplasm of breast: Secondary | ICD-10-CM | POA: Diagnosis not present

## 2023-03-01 ENCOUNTER — Other Ambulatory Visit: Payer: Self-pay | Admitting: Internal Medicine

## 2023-03-01 DIAGNOSIS — F331 Major depressive disorder, recurrent, moderate: Secondary | ICD-10-CM

## 2023-03-01 NOTE — Telephone Encounter (Signed)
Requested Prescriptions  Refused Prescriptions Disp Refills   buPROPion ER (WELLBUTRIN SR) 100 MG 12 hr tablet [Pharmacy Med Name: BUPROPION HCL SR 100 MG TABLET] 180 tablet 1    Sig: TAKE 1 TABLET BY MOUTH TWICE A DAY     Psychiatry: Antidepressants - bupropion Passed - 03/01/2023  8:52 AM      Passed - Cr in normal range and within 360 days    Creat  Date Value Ref Range Status  07/06/2022 1.05 0.50 - 1.05 mg/dL Final         Passed - AST in normal range and within 360 days    AST  Date Value Ref Range Status  07/06/2022 23 10 - 35 U/L Final         Passed - ALT in normal range and within 360 days    ALT  Date Value Ref Range Status  07/06/2022 24 6 - 29 U/L Final         Passed - Completed PHQ-2 or PHQ-9 in the last 360 days      Passed - Last BP in normal range    BP Readings from Last 1 Encounters:  12/25/22 123/64         Passed - Valid encounter within last 6 months    Recent Outpatient Visits           3 months ago Moderate episode of recurrent major depressive disorder [facility])    [facility] Margarita Mail, DO   4 months ago Moderate episode of recurrent major depressive disorder [facility])   [facility] Margarita Mail, DO   5 months ago Moderate episode of recurrent major depressive disorder [facility])   [facility] Margarita Mail, DO   7 months ago Moderate episode of recurrent major depressive disorder [facility])   [facility] Margarita Mail, DO   7 months ago Mixed hyperlipidemia   [facility] Margarita Mail, DO       Future Appointments             In 2 months Margarita Mail, DO [facility], [facility]

## 2023-04-09 ENCOUNTER — Other Ambulatory Visit: Payer: Self-pay | Admitting: Internal Medicine

## 2023-04-09 DIAGNOSIS — F331 Major depressive disorder, recurrent, moderate: Secondary | ICD-10-CM

## 2023-04-10 NOTE — Telephone Encounter (Signed)
Requested medication (s) are due for refill today:   Yes  Requested medication (s) are on the active medication list:   Yes  Future visit scheduled:   Yes 05/18/2023   Last ordered: 02/15/2023 #90, 1 refill  Returned because a DX Code and a 90 day supply being requested.   Requested Prescriptions  Pending Prescriptions Disp Refills   buPROPion ER (WELLBUTRIN SR) 100 MG 12 hr tablet [Pharmacy Med Name: BUPROPION HCL SR 100 MG TABLET] 180 tablet 1    Sig: TAKE 1 TABLET BY MOUTH TWICE A DAY     Psychiatry: Antidepressants - bupropion Passed - 04/09/2023  1:31 PM      Passed - Cr in normal range and within 360 days    Creat  Date Value Ref Range Status  07/06/2022 1.05 0.50 - 1.05 mg/dL Final         Passed - AST in normal range and within 360 days    AST  Date Value Ref Range Status  07/06/2022 23 10 - 35 U/L Final         Passed - ALT in normal range and within 360 days    ALT  Date Value Ref Range Status  07/06/2022 24 6 - 29 U/L Final         Passed - Completed PHQ-2 or PHQ-9 in the last 360 days      Passed - Last BP in normal range    BP Readings from Last 1 Encounters:  12/25/22 123/64         Passed - Valid encounter within last 6 months    Recent Outpatient Visits           4 months ago Moderate episode of recurrent major depressive disorder [facility])   [facility] Margarita Mail, DO   6 months ago Moderate episode of recurrent major depressive disorder [facility])   [facility] Margarita Mail, DO   7 months ago Moderate episode of recurrent major depressive disorder [facility])   [facility] Margarita Mail, DO   8 months ago Moderate episode of recurrent major depressive disorder [facility])   [facility] Margarita Mail, DO   9 months ago Mixed hyperlipidemia   [facility] Margarita Mail, DO       Future Appointments              In 1 month Margarita Mail, DO [facility], [facility]

## 2023-04-24 DIAGNOSIS — Z0100 Encounter for examination of eyes and vision without abnormal findings: Secondary | ICD-10-CM | POA: Diagnosis not present

## 2023-04-24 DIAGNOSIS — H524 Presbyopia: Secondary | ICD-10-CM | POA: Diagnosis not present

## 2023-05-18 ENCOUNTER — Encounter: Payer: Self-pay | Admitting: Internal Medicine

## 2023-05-18 ENCOUNTER — Ambulatory Visit: Payer: Medicare HMO | Admitting: Internal Medicine

## 2023-05-18 VITALS — BP 122/78 | HR 78 | Temp 98.0°F | Resp 16 | Ht 64.5 in | Wt 185.6 lb

## 2023-05-18 DIAGNOSIS — J439 Emphysema, unspecified: Secondary | ICD-10-CM | POA: Diagnosis not present

## 2023-05-18 DIAGNOSIS — F331 Major depressive disorder, recurrent, moderate: Secondary | ICD-10-CM | POA: Diagnosis not present

## 2023-05-18 DIAGNOSIS — J309 Allergic rhinitis, unspecified: Secondary | ICD-10-CM

## 2023-05-18 DIAGNOSIS — E782 Mixed hyperlipidemia: Secondary | ICD-10-CM

## 2023-05-18 MED ORDER — PRAVASTATIN SODIUM 20 MG PO TABS: 20.0000 mg | ORAL_TABLET | ORAL | 1 refills | Status: DC

## 2023-05-18 MED ORDER — BUPROPION HCL ER (SR) 150 MG PO TB12: 150.0000 mg | ORAL_TABLET | Freq: Two times a day (BID) | ORAL | 2 refills | Status: DC

## 2023-05-18 NOTE — Assessment & Plan Note (Signed)
 Plan to recheck fasting labs at follow up, continue statin which was refilled.

## 2023-05-18 NOTE — Assessment & Plan Note (Signed)
 Stable, has Albuterol inhaler and only had to use a few times.

## 2023-05-18 NOTE — Progress Notes (Signed)
 Established Patient Office Visit  Subjective   Patient ID: Shannon [name], female    DOB: 03/17/1951  Age: 73 y.o. MRN: [id number]  Chief Complaint  Patient presents with   Medical Management of Chronic Issues    HPI  Shannon [name] presents for follow up on chronic medical conditions.   MDD: -Has a special needs son which is stressful and other difficult family dynamics - also her husband was just diagnosed with bladder cancer and started radiation which has been stressful -Mood status: stable  -Current treatment: Wellbutrin  100 mg SR BID - patient overall feels well but sometimes forgets to take the morning dose -Had been on Wellbutrin  150 mg XL but had the occasional difficulty falling asleep -Failed Meds: Lexapro  caused dizziness -Symptom severity: moderate  Psychotherapy/counseling: yes, new, went once but isn't sure if she will go back because it was expensive  Previous psychiatric medications: celexa , paxil, prozac, wellbutrin , and zoloft, lexapro       05/18/2023    1:03 PM 11/15/2022   10:22 AM 10/11/2022   10:30 AM 09/08/2022    1:26 PM 09/08/2022    1:18 PM  Depression screen PHQ 2/9  Decreased Interest 1 0 1 1 1   Down, Depressed, Hopeless 1 1 1 1 1   PHQ - 2 Score 2 1 2 2 2   Altered sleeping 1 0  2   Tired, decreased energy 1 0  2   Change in appetite 1 0  0   Feeling bad or failure about yourself  0 0  0   Trouble concentrating 0 0  0   Moving slowly or fidgety/restless 0 0  0   Suicidal thoughts 0 0  0   PHQ-9 Score 5 1  6    Difficult doing work/chores Somewhat difficult Not difficult at all  Somewhat difficult     HLD: -Medications: Pravastatin  20 mg every other day, aspirin  -Patient is compliant with above medications and reports no side effects.  -Last lipid panel: Lipid Panel     Component Value Date/Time   CHOL 212 (H) 07/06/2022 1443   CHOL 254 (H) 07/04/2019 1438   TRIG 137 07/06/2022 1443   HDL 70 07/06/2022 1443   HDL 70 07/04/2019 1438    CHOLHDL 3.0 07/06/2022 1443   VLDL 14.2 06/02/2021 1025   LDLCALC 117 (H) 07/06/2022 1443   LABVLDL 24 07/04/2019 1438    Allergies: -Currently on Flonase , had been on Allegra but does not take every day. -Symptoms well-controlled  COPD: -COPD status: stable - seen on lung CT -Current medications: Albuterol  PRN - only had to use once or twice with exertion, helped her symptoms immediately  -Oxygen use: no -Dyspnea frequency: occasionally with walking her dog -Cough frequency: no -Rescue inhaler frequency: rarely  -Limitation of activity: no -Productive cough: no  Health Maintenance: -Blood work UTD -Mammogram 10/24  -Colonoscopy 2015, repeat in 2025 -Lung cancer screening UTD 6/24  Patient Active Problem List   Diagnosis Date Noted   Pulmonary emphysema, unspecified emphysema type (HCC) 05/18/2023   Moderate episode of recurrent major depressive disorder (HCC) 05/18/2023   Stress incontinence 12/25/2022   Cystocele with third degree uterine prolapse 09/04/2022   Hand arthritis 06/02/2021   B12 deficiency 01/25/2020   Chronic low back pain 01/05/2020   Depression, recurrent (HCC) 01/05/2020   Obesity (BMI 30.0-34.9) 01/05/2020   Psychosocial stressors 01/05/2020   Elevated blood pressure reading 01/07/2019   Scoliosis of lumbar spine 01/07/2019   Left-sided low back  pain with left-sided sciatica 01/07/2019   Hyperlipidemia 12/13/2018   Osteopenia 12/13/2018   Anxiety    Allergic rhinitis    Arthritis    Status post total replacement of hip 09/25/2016   Past Medical History:  Diagnosis Date   Allergic rhinitis    Anal fissure    child   Anxiety    Arthritis    Osteoarthritis mild DDD lumbar spine noted in 2018    COVID-19    05/2020   Headache    Hyperlipidemia    Stress    Trochanteric bursitis    Vitamin B12 deficiency    Vitamin D  deficiency    Past Surgical History:  Procedure Laterality Date   ANAL FISSURE REPAIR     CATARACT EXTRACTION     b/l  10 and 04/2018   CERVICAL CERCLAGE      X 3   JOINT REPLACEMENT     TOTAL HIP ARTHROPLASTY Right 08/24/2014   Dr. Mardee, [facility]   TOTAL HIP ARTHROPLASTY Left 09/25/2016   Procedure: TOTAL HIP ARTHROPLASTY;  Surgeon: Mardee Lynwood SQUIBB, MD;  Location: [facility] ORS;  Service: Orthopedics;  Laterality: Left;   Social History   Tobacco Use   Smoking status: Former    Current packs/day: 0.00    Average packs/day: 1.5 packs/day for 45.0 years (67.5 ttl pk-yrs)    Types: Cigarettes    Start date: 08/12/1969    Quit date: 08/13/2014    Years since quitting: 8.7   Smokeless tobacco: Never  Vaping Use   Vaping status: Former  Substance Use Topics   Alcohol  use: No   Drug use: No   Social History   Socioeconomic History   Marital status: Divorced    Spouse name: Not on file   Number of children: Not on file   Years of education: Not on file   Highest education level: Some college, no degree  Occupational History   Not on file  Tobacco Use   Smoking status: Former    Current packs/day: 0.00    Average packs/day: 1.5 packs/day for 45.0 years (67.5 ttl pk-yrs)    Types: Cigarettes    Start date: 08/12/1969    Quit date: 08/13/2014    Years since quitting: 8.7   Smokeless tobacco: Never  Vaping Use   Vaping status: Former  Substance and Sexual Activity   Alcohol  use: No   Drug use: No   Sexual activity: Not Currently  Other Topics Concern   Not on file  Social History Narrative   Lives with long term boyfriend 11 years older than her   1 son special needs lives in own place 71 y.o    3 kids total all sons      Shannon [name] son DPR      Used to work at [employer]   Social Drivers of Health   Financial Resource Strain: Medium Risk (11/13/2022)   Overall Financial Resource Strain (CARDIA)    Difficulty of Paying Living Expenses: Somewhat hard  Food Insecurity: No Food Insecurity (11/13/2022)   Hunger Vital Sign    Worried About Running Out of Food in the Last Year: Never true     Ran Out of Food in the Last Year: Never true  Transportation Needs: No Transportation Needs (11/13/2022)   PRAPARE - Administrator, Civil Service (Medical): No    Lack of Transportation (Non-Medical): No  Physical Activity: Sufficiently Active (11/13/2022)   Exercise Vital Sign    Days  of Exercise per Week: 5 days    Minutes of Exercise per Session: 30 min  Stress: No Stress Concern Present (11/13/2022)   [employer] of Occupational Health - Occupational Stress Questionnaire    Feeling of Stress : Only a little  Social Connections: Moderately Isolated (11/13/2022)   Social Connection and Isolation Panel [NHANES]    Frequency of Communication with Friends and Family: Once a week    Frequency of Social Gatherings with Friends and Family: Once a week    Attends Religious Services: 1 to 4 times per year    Active Member of [employer] or Organizations: No    Attends Engineer, Structural: Not on file    Marital Status: Living with partner  Intimate Partner Violence: Not on file   Family Status  Relation Name Status   Mother  Deceased   Father  Deceased   Sister  Deceased   Sister  (Not Specified)   Son  Alive   Son  Alive   Son  Alive   Neg Hx  (Not Specified)  No partnership data on file   Family History  Problem Relation Age of Onset   Osteoporosis Mother    Arthritis/Rheumatoid Mother    Hyperlipidemia Mother    Hypertension Father    Lung cancer Father        deid 17   Other Father        brain tumor   Hyperlipidemia Father    CAD Father        s/p cabg   Other Sister        brain tumor glioblastoma died as of 02-Jun-2019   Hypercalcemia Sister    Hyperlipidemia Sister    Gout Son    Other Son        cognitive d/o   Breast cancer Neg Hx    Allergies  Allergen Reactions   Celexa  [Citalopram ]     H/a, sweating, brain fog   Paxil [Paroxetine]     Sweating increased     Zoloft [Sertraline Hcl] Itching      Review of Systems  All other systems  reviewed and are negative.     Objective:     BP 122/78   Pulse 78   Temp 98 F (36.7 C) (Oral)   Resp 16   Ht 5' 4.5 (1.638 m)   Wt 185 lb 9.6 oz (84.2 kg)   SpO2 95%   BMI 31.37 kg/m  BP Readings from Last 3 Encounters:  05/18/23 122/78  12/25/22 123/64  11/15/22 130/82   Wt Readings from Last 3 Encounters:  05/18/23 185 lb 9.6 oz (84.2 kg)  12/25/22 184 lb (83.5 kg)  11/15/22 184 lb 4.8 oz (83.6 kg)      Physical Exam Constitutional:      Appearance: Normal appearance.  HENT:     Head: Normocephalic and atraumatic.  Eyes:     Conjunctiva/sclera: Conjunctivae normal.  Cardiovascular:     Rate and Rhythm: Normal rate and regular rhythm.  Pulmonary:     Effort: Pulmonary effort is normal.     Breath sounds: Normal breath sounds.  Skin:    General: Skin is warm and dry.  Neurological:     General: No focal deficit present.     Mental Status: She is alert. Mental status is at baseline.  Psychiatric:        Mood and Affect: Mood normal.        Behavior: Behavior normal.  No results found for any visits on 05/18/23.  Last CBC Lab Results  Component Value Date   WBC 8.3 07/06/2022   HGB 13.8 07/06/2022   HCT 41.1 07/06/2022   MCV 83.0 07/06/2022   MCH 27.9 07/06/2022   RDW 14.2 07/06/2022   PLT 288 07/06/2022   Last metabolic panel Lab Results  Component Value Date   GLUCOSE 82 07/06/2022   NA 141 07/06/2022   K 4.5 07/06/2022   CL 105 07/06/2022   CO2 26 07/06/2022   BUN 15 07/06/2022   CREATININE 1.05 07/06/2022   EGFR 58 (L) 07/06/2022   CALCIUM  10.1 07/06/2022   PROT 6.9 07/06/2022   ALBUMIN 4.2 06/02/2021   BILITOT 0.4 07/06/2022   ALKPHOS 99 06/02/2021   AST 23 07/06/2022   ALT 24 07/06/2022   ANIONGAP 6 09/27/2016   Last lipids Lab Results  Component Value Date   CHOL 212 (H) 07/06/2022   HDL 70 07/06/2022   LDLCALC 117 (H) 07/06/2022   TRIG 137 07/06/2022   CHOLHDL 3.0 07/06/2022   Last hemoglobin A1c No results  found for: HGBA1C Last thyroid functions Lab Results  Component Value Date   TSH 0.62 06/02/2021   Last vitamin D  Lab Results  Component Value Date   VD25OH 38 07/06/2022   Last vitamin B12 and Folate Lab Results  Component Value Date   VITAMINB12 776 06/29/2020      The 10-year ASCVD risk score (Arnett DK, et al., 2019) is: 8.4%    Assessment & Plan:  Moderate episode of recurrent major depressive disorder (HCC) Assessment & Plan: Change Wellbutrin  to 150 mg SR BID, recheck in 3 months or sooner as needed.   Orders: -     buPROPion  HCl ER (SR); Take 1 tablet (150 mg total) by mouth 2 (two) times daily.  Dispense: 60 tablet; Refill: 2  Mixed hyperlipidemia Assessment & Plan: Plan to recheck fasting labs at follow up, continue statin which was refilled.  Orders: -     Pravastatin  Sodium; Take 1 tablet (20 mg total) by mouth every other day. After 6 pm  Dispense: 90 tablet; Refill: 1  Pulmonary emphysema, unspecified emphysema type (HCC) Assessment & Plan: Stable, has Albuterol  inhaler and only had to use a few times.    Allergic rhinitis, unspecified seasonality, unspecified trigger Assessment & Plan: Stable, not on anything currently.       Return in about 3 months (around 08/16/2023).    Sharyle Fischer, DO

## 2023-05-18 NOTE — Assessment & Plan Note (Signed)
 Change Wellbutrin to 150 mg SR BID, recheck in 3 months or sooner as needed.

## 2023-05-18 NOTE — Patient Instructions (Addendum)
 It was great seeing you today!  Plan discussed at today's visit: -Blood work ordered today, results will be uploaded to MyChart.  -Change Wellbutrin  dose - take first dose of the day 150 mg (aim for 8-9 am), if morning dose missed, take 150 mg dose whenever you remember and hold second dose. If first dose is taken earlier than 9 am, plan to take second dose (100 mg) at 9 pm   Follow up in: 3 months, please be fasting for labs  Take care and let us  know if you have any questions or concerns prior to your next visit.  Dr. Bernardo

## 2023-05-18 NOTE — Assessment & Plan Note (Signed)
 Stable, not on anything currently.

## 2023-06-10 ENCOUNTER — Other Ambulatory Visit: Payer: Self-pay | Admitting: Internal Medicine

## 2023-06-10 DIAGNOSIS — F331 Major depressive disorder, recurrent, moderate: Secondary | ICD-10-CM

## 2023-06-12 NOTE — Telephone Encounter (Signed)
Requested Prescriptions  Refused Prescriptions Disp Refills   buPROPion (WELLBUTRIN SR) 150 MG 12 hr tablet [Pharmacy Med Name: BUPROPION HCL SR 150 MG TABLET] 180 tablet 1    Sig: TAKE 1 TABLET BY MOUTH TWICE A DAY     Psychiatry: Antidepressants - bupropion Passed - 06/12/2023  3:43 PM      Passed - Cr in normal range and within 360 days    Creat  Date Value Ref Range Status  07/06/2022 1.05 0.50 - 1.05 mg/dL Final         Passed - AST in normal range and within 360 days    AST  Date Value Ref Range Status  07/06/2022 23 10 - 35 U/L Final         Passed - ALT in normal range and within 360 days    ALT  Date Value Ref Range Status  07/06/2022 24 6 - 29 U/L Final         Passed - Completed PHQ-2 or PHQ-9 in the last 360 days      Passed - Last BP in normal range    BP Readings from Last 1 Encounters:  05/18/23 122/78         Passed - Valid encounter within last 6 months    Recent Outpatient Visits           3 weeks ago Moderate episode of recurrent major depressive disorder [facility])   [facility] Margarita Mail, DO   6 months ago Moderate episode of recurrent major depressive disorder [facility])   [facility] Margarita Mail, DO   8 months ago Moderate episode of recurrent major depressive disorder [facility])   [facility] Margarita Mail, DO   9 months ago Moderate episode of recurrent major depressive disorder [facility])   [facility] Margarita Mail, DO   10 months ago Moderate episode of recurrent major depressive disorder [facility])   [facility] Margarita Mail, DO       Future Appointments             In 2 months Margarita Mail, DO [facility] 

## 2023-06-14 ENCOUNTER — Other Ambulatory Visit: Payer: Self-pay | Admitting: Internal Medicine

## 2023-06-14 DIAGNOSIS — F331 Major depressive disorder, recurrent, moderate: Secondary | ICD-10-CM

## 2023-06-15 NOTE — Telephone Encounter (Signed)
Requested Prescriptions  Refused Prescriptions Disp Refills   buPROPion ER (WELLBUTRIN SR) 100 MG 12 hr tablet [Pharmacy Med Name: BUPROPION HCL SR 100 MG TABLET] 60 tablet 1    Sig: TAKE 1 TABLET BY MOUTH TWICE A DAY     Psychiatry: Antidepressants - bupropion Passed - 06/15/2023  8:47 AM      Passed - Cr in normal range and within 360 days    Creat  Date Value Ref Range Status  07/06/2022 1.05 0.50 - 1.05 mg/dL Final         Passed - AST in normal range and within 360 days    AST  Date Value Ref Range Status  07/06/2022 23 10 - 35 U/L Final         Passed - ALT in normal range and within 360 days    ALT  Date Value Ref Range Status  07/06/2022 24 6 - 29 U/L Final         Passed - Completed PHQ-2 or PHQ-9 in the last 360 days      Passed - Last BP in normal range    BP Readings from Last 1 Encounters:  05/18/23 122/78         Passed - Valid encounter within last 6 months    Recent Outpatient Visits           4 weeks ago Moderate episode of recurrent major depressive disorder [facility])   [facility] Margarita Mail, DO   7 months ago Moderate episode of recurrent major depressive disorder [facility])   [facility] Margarita Mail, DO   8 months ago Moderate episode of recurrent major depressive disorder [facility])   [facility] Margarita Mail, DO   9 months ago Moderate episode of recurrent major depressive disorder [facility])   [facility] Margarita Mail, DO   10 months ago Moderate episode of recurrent major depressive disorder [facility])   [facility] Margarita Mail, DO       Future Appointments             In 2 months Margarita Mail, DO [place] [facility], [facility]

## 2023-07-16 ENCOUNTER — Telehealth: Admitting: Family Medicine

## 2023-07-16 DIAGNOSIS — B9689 Other specified bacterial agents as the cause of diseases classified elsewhere: Secondary | ICD-10-CM

## 2023-07-16 DIAGNOSIS — J019 Acute sinusitis, unspecified: Secondary | ICD-10-CM | POA: Diagnosis not present

## 2023-07-16 MED ORDER — AMOXICILLIN-POT CLAVULANATE 875-125 MG PO TABS: 1.0000 | ORAL_TABLET | Freq: Two times a day (BID) | ORAL | 0 refills | Status: AC

## 2023-07-16 NOTE — Progress Notes (Signed)

## 2023-08-16 ENCOUNTER — Other Ambulatory Visit: Payer: Self-pay

## 2023-08-16 ENCOUNTER — Encounter: Payer: Self-pay | Admitting: Internal Medicine

## 2023-08-16 ENCOUNTER — Ambulatory Visit: Payer: Self-pay | Admitting: Internal Medicine

## 2023-08-16 VITALS — BP 130/72 | HR 80 | Temp 98.0°F | Resp 16 | Ht 64.5 in | Wt 179.9 lb

## 2023-08-16 DIAGNOSIS — Z1211 Encounter for screening for malignant neoplasm of colon: Secondary | ICD-10-CM

## 2023-08-16 DIAGNOSIS — J309 Allergic rhinitis, unspecified: Secondary | ICD-10-CM

## 2023-08-16 DIAGNOSIS — E538 Deficiency of other specified B group vitamins: Secondary | ICD-10-CM | POA: Diagnosis not present

## 2023-08-16 DIAGNOSIS — E559 Vitamin D deficiency, unspecified: Secondary | ICD-10-CM | POA: Diagnosis not present

## 2023-08-16 DIAGNOSIS — F331 Major depressive disorder, recurrent, moderate: Secondary | ICD-10-CM | POA: Diagnosis not present

## 2023-08-16 DIAGNOSIS — E782 Mixed hyperlipidemia: Secondary | ICD-10-CM

## 2023-08-16 MED ORDER — FLUTICASONE PROPIONATE 50 MCG/ACT NA SUSP: 2.0000 | Freq: Every day | NASAL | 11 refills | Status: AC

## 2023-08-16 MED ORDER — BUPROPION HCL ER (SR) 100 MG PO TB12: 100.0000 mg | ORAL_TABLET | Freq: Two times a day (BID) | ORAL | 1 refills | Status: DC

## 2023-08-16 NOTE — Assessment & Plan Note (Signed)
 Worse lately, continue Allegra. Also discussed OTC Zaditor and Astelin.

## 2023-08-16 NOTE — Assessment & Plan Note (Signed)
 On supplements, recheck levels.

## 2023-08-16 NOTE — Assessment & Plan Note (Signed)
 Recheck levels

## 2023-08-16 NOTE — Progress Notes (Signed)
 Established Patient Office Visit  Subjective   Patient ID: Shannon [name], female    DOB: January 22, 1951  Age: 73 y.o. MRN: [id number]  Chief Complaint  Patient presents with   Medical Management of Chronic Issues    3 month recheck    HPI  Shannon [name] presents for follow up on chronic medical conditions.   MDD: -Has a special needs son which is stressful and other difficult family dynamics - also her husband was just diagnosed with bladder cancer and started radiation which has been stressful -Mood status: stable  -Current treatment: Wellbutrin increased to 150 mg SR BID at last office visit, today she feels like the dose is too high  -Had been on Wellbutrin 150 mg XL but had the occasional difficulty falling asleep -Failed Meds: Lexapro caused dizziness -Symptom severity: moderate  Psychotherapy/counseling: yes, new, went once but isn't sure if she will go back because it was expensive  Previous psychiatric medications: celexa, paxil, prozac, wellbutrin, and zoloft, lexapro      08/16/2023    1:52 PM 05/18/2023    1:03 PM 11/15/2022   10:22 AM 10/11/2022   10:30 AM 09/08/2022    1:26 PM  Depression screen PHQ 2/9  Decreased Interest 1 1 0 1 1  Down, Depressed, Hopeless 1 1 1 1 1   PHQ - 2 Score 2 2 1 2 2   Altered sleeping 0 1 0  2  Tired, decreased energy 0 1 0  2  Change in appetite 0 1 0  0  Feeling bad or failure about yourself   0 0  0  Trouble concentrating 0 0 0  0  Moving slowly or fidgety/restless 0 0 0  0  Suicidal thoughts 0 0 0  0  PHQ-9 Score 2 5 1  6   Difficult doing work/chores Not difficult at all Somewhat difficult Not difficult at all  Somewhat difficult    HLD: -Medications: Pravastatin 20 mg every other day, aspirin  -Patient is compliant with above medications and reports no side effects.  -Last lipid panel: Lipid Panel     Component Value Date/Time   CHOL 212 (H) 07/06/2022 1443   CHOL 254 (H) 07/04/2019 1438   TRIG 137 07/06/2022 1443   HDL  70 07/06/2022 1443   HDL 70 07/04/2019 1438   CHOLHDL 3.0 07/06/2022 1443   VLDL 14.2 06/02/2021 1025   LDLCALC 117 (H) 07/06/2022 1443   LABVLDL 24 07/04/2019 1438    Allergies: -Currently on Flonase, had been on Allegra but does not take every day. -Symptoms exacerbated currently   COPD: -COPD status: stable - seen on lung CT -Current medications: Albuterol PRN - only had to use once or twice with exertion but did have to take yesterday  -Oxygen use: no -Dyspnea frequency: occasionally with walking her dog -Cough frequency: no -Rescue inhaler frequency: rarely  -Limitation of activity: no -Productive cough: no  Health Maintenance: -Blood work due -Mammogram 10/24 Birads-1 -Colonoscopy 2015, repeat in 2025, referral placed  -Lung cancer screening UTD 6/24  Patient Active Problem List   Diagnosis Date Noted   Vitamin D deficiency 08/16/2023   Pulmonary emphysema, unspecified emphysema type (HCC) 05/18/2023   Moderate episode of recurrent major depressive disorder (HCC) 05/18/2023   Stress incontinence 12/25/2022   Cystocele with third degree uterine prolapse 09/04/2022   Hand arthritis 06/02/2021   Vitamin B12 deficiency 01/25/2020   Chronic low back pain 01/05/2020   Depression, recurrent (HCC) 01/05/2020   Obesity (BMI  30.0-34.9) 01/05/2020   Psychosocial stressors 01/05/2020   Elevated blood pressure reading 01/07/2019   Scoliosis of lumbar spine 01/07/2019   Left-sided low back pain with left-sided sciatica 01/07/2019   Hyperlipidemia 12/13/2018   Osteopenia 12/13/2018   Anxiety    Allergic rhinitis    Arthritis    Status post total replacement of hip 09/25/2016   Past Medical History:  Diagnosis Date   Allergic rhinitis    Anal fissure    child   Anxiety    Arthritis    Osteoarthritis mild DDD lumbar spine noted in 2018    COVID-19    05/2020   Headache    Hyperlipidemia    Stress    Trochanteric bursitis    Vitamin B12 deficiency    Vitamin D  deficiency    Past Surgical History:  Procedure Laterality Date   ANAL FISSURE REPAIR     CATARACT EXTRACTION     b/l 10 and 04/2018   CERVICAL CERCLAGE      X 3   JOINT REPLACEMENT     TOTAL HIP ARTHROPLASTY Right 08/24/2014   Dr. Ernest Pine, [facility]   TOTAL HIP ARTHROPLASTY Left 09/25/2016   Procedure: TOTAL HIP ARTHROPLASTY;  Surgeon: Donato Heinz, MD;  Location: [facility] ORS;  Service: Orthopedics;  Laterality: Left;   Social History   Tobacco Use   Smoking status: Former    Current packs/day: 0.00    Average packs/day: 1.5 packs/day for 45.0 years (67.5 ttl pk-yrs)    Types: Cigarettes    Start date: 08/12/1969    Quit date: 08/13/2014    Years since quitting: 9.0   Smokeless tobacco: Never  Vaping Use   Vaping status: Former  Substance Use Topics   Alcohol use: No   Drug use: No   Social History   Socioeconomic History   Marital status: Divorced    Spouse name: Not on file   Number of children: Not on file   Years of education: Not on file   Highest education level: Some college, no degree  Occupational History   Not on file  Tobacco Use   Smoking status: Former    Current packs/day: 0.00    Average packs/day: 1.5 packs/day for 45.0 years (67.5 ttl pk-yrs)    Types: Cigarettes    Start date: 08/12/1969    Quit date: 08/13/2014    Years since quitting: 9.0   Smokeless tobacco: Never  Vaping Use   Vaping status: Former  Substance and Sexual Activity   Alcohol use: No   Drug use: No   Sexual activity: Not Currently  Other Topics Concern   Not on file  Social History Narrative   Lives with long term boyfriend 11 years older than her   1 son special needs lives in own place 68 y.o    3 kids total all sons      Shannon [name] son DPR      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   Social Drivers of Health   Financial Resource Strain: Medium Risk (11/13/2022)   Overall Financial Resource Strain (CARDIA)    Difficulty of Paying Living Expenses: Somewhat hard  Food  Insecurity: No Food Insecurity (11/13/2022)   Hunger Vital Sign    Worried About Running Out of Food in the Last Year: Never true    Ran Out of Food in the Last Year: Never true  Transportation Needs: No Transportation Needs (11/13/2022)   PRAPARE - Transportation    Lack of  Transportation (Medical): No    Lack of Transportation (Non-Medical): No  Physical Activity: Sufficiently Active (11/13/2022)   Exercise Vital Sign    Days of Exercise per Week: 5 days    Minutes of Exercise per Session: 30 min  Stress: No Stress Concern Present (11/13/2022)   [employer] of Occupational Health - Occupational Stress Questionnaire    Feeling of Stress : Only a little  Social Connections: Moderately Isolated (11/13/2022)   Social Connection and Isolation Panel [NHANES]    Frequency of Communication with Friends and Family: Once a week    Frequency of Social Gatherings with Friends and Family: Once a week    Attends Religious Services: 1 to 4 times per year    Active Member of [employer] or Organizations: No    Attends Engineer, structural: Not on file    Marital Status: Living with partner  Intimate Partner Violence: Not on file   Family Status  Relation Name Status   Mother  Deceased   Father  Deceased   Sister  Deceased   Sister  (Not Specified)   Son  Alive   Son  Alive   Son  Alive   Neg Hx  (Not Specified)  No partnership data on file   Family History  Problem Relation Age of Onset   Osteoporosis Mother    Arthritis/Rheumatoid Mother    Hyperlipidemia Mother    Hypertension Father    Lung cancer Father        deid 31   Other Father        brain tumor   Hyperlipidemia Father    CAD Father        s/p cabg   Other Sister        brain tumor glioblastoma died as of 2019/05/18   Hypercalcemia Sister    Hyperlipidemia Sister    Gout Son    Other Son        cognitive d/o   Breast cancer Neg Hx    Allergies  Allergen Reactions   Celexa [Citalopram]     H/a, sweating, brain  fog   Paxil [Paroxetine]     Sweating increased     Zoloft [Sertraline Hcl] Itching      Review of Systems  All other systems reviewed and are negative.     Objective:     BP 130/72 (Cuff Size: Large)   Pulse 80   Temp 98 F (36.7 C) (Oral)   Resp 16   Ht 5' 4.5" (1.638 m)   Wt 179 lb 14.4 oz (81.6 kg)   SpO2 98%   BMI 30.40 kg/m  BP Readings from Last 3 Encounters:  08/16/23 130/72  05/18/23 122/78  12/25/22 123/64   Wt Readings from Last 3 Encounters:  08/16/23 179 lb 14.4 oz (81.6 kg)  05/18/23 185 lb 9.6 oz (84.2 kg)  12/25/22 184 lb (83.5 kg)      Physical Exam Constitutional:      Appearance: Normal appearance.  HENT:     Head: Normocephalic and atraumatic.     Mouth/Throat:     Mouth: Mucous membranes are moist.     Pharynx: Oropharynx is clear.  Eyes:     Extraocular Movements: Extraocular movements intact.     Conjunctiva/sclera: Conjunctivae normal.     Pupils: Pupils are equal, round, and reactive to light.  Cardiovascular:     Rate and Rhythm: Normal rate and regular rhythm.  Pulmonary:     Effort: Pulmonary effort  is normal.     Breath sounds: Normal breath sounds.  Skin:    General: Skin is warm and dry.  Neurological:     General: No focal deficit present.     Mental Status: She is alert. Mental status is at baseline.  Psychiatric:        Mood and Affect: Mood normal.        Behavior: Behavior normal.      No results found for any visits on 08/16/23.  Last CBC Lab Results  Component Value Date   WBC 8.3 07/06/2022   HGB 13.8 07/06/2022   HCT 41.1 07/06/2022   MCV 83.0 07/06/2022   MCH 27.9 07/06/2022   RDW 14.2 07/06/2022   PLT 288 07/06/2022   Last metabolic panel Lab Results  Component Value Date   GLUCOSE 82 07/06/2022   NA 141 07/06/2022   K 4.5 07/06/2022   CL 105 07/06/2022   CO2 26 07/06/2022   BUN 15 07/06/2022   CREATININE 1.05 07/06/2022   EGFR 58 (L) 07/06/2022   CALCIUM 10.1 07/06/2022   PROT 6.9  07/06/2022   ALBUMIN 4.2 06/02/2021   BILITOT 0.4 07/06/2022   ALKPHOS 99 06/02/2021   AST 23 07/06/2022   ALT 24 07/06/2022   ANIONGAP 6 09/27/2016   Last lipids Lab Results  Component Value Date   CHOL 212 (H) 07/06/2022   HDL 70 07/06/2022   LDLCALC 117 (H) 07/06/2022   TRIG 137 07/06/2022   CHOLHDL 3.0 07/06/2022   Last hemoglobin A1c No results found for: "HGBA1C" Last thyroid functions Lab Results  Component Value Date   TSH 0.62 06/02/2021   Last vitamin D Lab Results  Component Value Date   VD25OH 38 07/06/2022   Last vitamin B12 and Folate Lab Results  Component Value Date   VITAMINB12 776 06/29/2020      The 10-year ASCVD risk score (Arnett DK, et al., 2019) is: 10.6%    Assessment & Plan:  Moderate episode of recurrent major depressive disorder (HCC) Assessment & Plan: Decrease Wellbutrin back to 100 mg SR BID. Labs due.  Orders: -     buPROPion HCl ER (SR); Take 1 tablet (100 mg total) by mouth 2 (two) times daily.  Dispense: 180 tablet; Refill: 1  Allergic rhinitis, unspecified seasonality, unspecified trigger Assessment & Plan: Worse lately, continue Allegra. Also discussed OTC Zaditor and Astelin.   Orders: -     Fluticasone Propionate; Place 2 sprays into both nostrils daily.  Dispense: 16 g; Refill: 11 -     CBC with Differential/Platelet -     COMPLETE METABOLIC PANEL WITHOUT GFR  Mixed hyperlipidemia Assessment & Plan: Recheck labs, continue statin.  Orders: -     Lipid panel  Vitamin D deficiency Assessment & Plan: On supplements, recheck levels.   Orders: -     VITAMIN D 25 Hydroxy (Vit-D Deficiency, Fractures)  Vitamin B12 deficiency Assessment & Plan: Recheck levels.  Orders: -     Vitamin B12  Colon cancer screening -     Ambulatory referral to Gastroenterology      Return in about 6 months (around 02/15/2024).    Margarita Mail, DO

## 2023-08-16 NOTE — Assessment & Plan Note (Signed)
 Decrease Wellbutrin back to 100 mg SR BID. Labs due.

## 2023-08-16 NOTE — Assessment & Plan Note (Signed)
 Recheck labs, continue statin.

## 2023-08-17 LAB — COMPLETE METABOLIC PANEL WITHOUT GFR
AG Ratio: 1.7 (calc) (ref 1.0–2.5)
ALT: 23 U/L (ref 6–29)
AST: 25 U/L (ref 10–35)
Albumin: 4.3 g/dL (ref 3.6–5.1)
Alkaline phosphatase (APISO): 92 U/L (ref 37–153)
BUN: 16 mg/dL (ref 7–25)
CO2: 28 mmol/L (ref 20–32)
Calcium: 9.9 mg/dL (ref 8.6–10.4)
Chloride: 104 mmol/L (ref 98–110)
Creat: 0.81 mg/dL (ref 0.60–1.00)
Globulin: 2.6 g/dL (ref 1.9–3.7)
Glucose, Bld: 87 mg/dL (ref 65–99)
Potassium: 4.9 mmol/L (ref 3.5–5.3)
Sodium: 140 mmol/L (ref 135–146)
Total Bilirubin: 0.5 mg/dL (ref 0.2–1.2)
Total Protein: 6.9 g/dL (ref 6.1–8.1)

## 2023-08-17 LAB — CBC WITH DIFFERENTIAL/PLATELET
Absolute Lymphocytes: 2426 {cells}/uL (ref 850–3900)
Absolute Monocytes: 432 {cells}/uL (ref 200–950)
Basophils Absolute: 43 {cells}/uL (ref 0–200)
Basophils Relative: 0.6 %
Eosinophils Absolute: 274 {cells}/uL (ref 15–500)
Eosinophils Relative: 3.8 %
HCT: 41.1 % (ref 35.0–45.0)
Hemoglobin: 13.5 g/dL (ref 11.7–15.5)
MCH: 27.8 pg (ref 27.0–33.0)
MCHC: 32.8 g/dL (ref 32.0–36.0)
MCV: 84.7 fL (ref 80.0–100.0)
MPV: 9.3 fL (ref 7.5–12.5)
Monocytes Relative: 6 %
Neutro Abs: 4025 {cells}/uL (ref 1500–7800)
Neutrophils Relative %: 55.9 %
Platelets: 282 10*3/uL (ref 140–400)
RBC: 4.85 10*6/uL (ref 3.80–5.10)
RDW: 14.4 % (ref 11.0–15.0)
Total Lymphocyte: 33.7 %
WBC: 7.2 10*3/uL (ref 3.8–10.8)

## 2023-08-17 LAB — VITAMIN B12: Vitamin B-12: 1225 pg/mL — ABNORMAL HIGH (ref 200–1100)

## 2023-08-17 LAB — VITAMIN D 25 HYDROXY (VIT D DEFICIENCY, FRACTURES): Vit D, 25-Hydroxy: 38 ng/mL (ref 30–100)

## 2023-08-17 LAB — LIPID PANEL
Cholesterol: 225 mg/dL — ABNORMAL HIGH (ref <200)
HDL: 73 mg/dL (ref >=50)
LDL Cholesterol (Calc): 133 mg/dL — ABNORMAL HIGH
Non-HDL Cholesterol (Calc): 152 mg/dL — ABNORMAL HIGH (ref <130)
Total CHOL/HDL Ratio: 3.1 (calc) (ref <5.0)
Triglycerides: 89 mg/dL (ref <150)

## 2023-08-30 ENCOUNTER — Encounter: Payer: Self-pay | Admitting: *Deleted

## 2023-09-06 ENCOUNTER — Telehealth: Payer: Self-pay

## 2023-09-06 NOTE — Telephone Encounter (Signed)
 Pt requesting call back to schedule colonoscopy.

## 2023-09-07 ENCOUNTER — Other Ambulatory Visit: Payer: Self-pay

## 2023-09-07 ENCOUNTER — Telehealth: Payer: Self-pay

## 2023-09-07 DIAGNOSIS — Z1211 Encounter for screening for malignant neoplasm of colon: Secondary | ICD-10-CM

## 2023-09-07 MED ORDER — NA SULFATE-K SULFATE-MG SULF 17.5-3.13-1.6 GM/177ML PO SOLN: 1.0000 | Freq: Once | ORAL | 0 refills | Status: AC

## 2023-09-07 NOTE — Telephone Encounter (Signed)
 Gastroenterology Pre-Procedure Review  Request Date: 11/06/23 Requesting Physician: Dr. Ole Berkeley  PATIENT REVIEW QUESTIONS: The patient responded to the following health history questions as indicated:    1. Are you having any GI issues? no 2. Do you have a personal history of Polyps? yes (June 2015 via [facility]) 3. Do you have a family history of Colon Cancer or Polyps? yes (mother had colon polyps) 4. Diabetes Mellitus? no 5. Joint replacements in the past 12 months?no 6. Major health problems in the past 3 months?no 7. Any artificial heart valves, MVP, or defibrillator?no    MEDICATIONS & ALLERGIES:    Patient reports the following regarding taking any anticoagulation/antiplatelet therapy:   Plavix, Coumadin, Eliquis, Xarelto, Lovenox , Pradaxa, Brilinta, or Effient? no Aspirin? yes (81 mg)  Patient confirms/reports the following medications:  Current Outpatient Medications  Medication Sig Dispense Refill   acetaminophen  (TYLENOL ) 500 MG tablet Take 500 mg by mouth every 6 (six) hours as needed for mild pain.     albuterol  (VENTOLIN  HFA) 108 (90 Base) MCG/ACT inhaler Inhale 2 puffs into the lungs every 6 (six) hours as needed for wheezing or shortness of breath. 8 g 2   Artificial Tear Solution (SOOTHE XP) SOLN Apply 1-2 drops to eye as needed (dry eye).     ASPIRIN 81 PO Take by mouth daily in the afternoon.     buPROPion  ER (WELLBUTRIN  SR) 100 MG 12 hr tablet Take 1 tablet (100 mg total) by mouth 2 (two) times daily. 180 tablet 1   Cholecalciferol  25 MCG (1000 UT) capsule Take 1,000 Units by mouth at bedtime.     cyanocobalamin  (VITAMIN B12) 500 MCG tablet Take 500 mcg by mouth daily.     fexofenadine (ALLEGRA) 180 MG tablet Take 180 mg by mouth daily as needed for allergies or rhinitis.     fluticasone  (FLONASE ) 50 MCG/ACT nasal spray Place 2 sprays into both nostrils daily. 16 g 11   ibuprofen (ADVIL) 200 MG tablet Take 200 mg by mouth every 6 (six) hours as needed.     pravastatin   (PRAVACHOL ) 20 MG tablet Take 1 tablet (20 mg total) by mouth every other day. After 6 pm 90 tablet 1   sodium chloride  (OCEAN) 0.65 % SOLN nasal spray Place 2 sprays into both nostrils as needed for congestion. (Patient not taking: Reported on 09/07/2023) 30 mL 12   No current facility-administered medications for this visit.    Patient confirms/reports the following allergies:  Allergies  Allergen Reactions   Celexa  [Citalopram ]     H/a, sweating, brain fog   Paxil [Paroxetine]     Sweating increased     Zoloft [Sertraline Hcl] Itching    No orders of the defined types were placed in this encounter.   AUTHORIZATION INFORMATION Primary Insurance: 1D#: Group #:  Secondary Insurance: 1D#: Group #:  SCHEDULE INFORMATION: Date: 11/06/23 Time: Location: [facility]

## 2023-09-10 NOTE — Telephone Encounter (Signed)
 okay

## 2023-10-26 ENCOUNTER — Telehealth: Payer: Self-pay

## 2023-10-26 NOTE — Progress Notes (Signed)
   10/26/2023  Patient ID: Shannon [name], female   DOB: 1951-02-25, 74 y.o.   MRN: [id number]  This patient is appearing on a report for being at risk of failing the adherence measure for identified medications this calendar year.   Medication Adherence Summary (STAR/HEDIS Monitoring): Adherence Category: cholesterol (statin)    Drug Name: Pravastatin  20 mg (every other day dosing) Last Fill or Sold Date:05/18/2023 Days' Supply: pharmacy dispensed quantity of 90 tablets but in the adherence portal the days supply is 90. However, actual days supply is 180 days (roughly)    Notes: ? Adherence data pulled from pharmacy claims portal Dr. Anson Basta. I called patient but unable to reach ? Reviewed barriers to adherence: written quantity. ? Plan: Will collaborate with provider to facilitate appropriate refill/ quantity needs.  Alexandria Angel, PharmD Clinical Pharmacist Cell: [phone]

## 2023-10-29 ENCOUNTER — Other Ambulatory Visit: Payer: Self-pay | Admitting: Internal Medicine

## 2023-10-29 ENCOUNTER — Telehealth: Payer: Self-pay

## 2023-10-29 DIAGNOSIS — E782 Mixed hyperlipidemia: Secondary | ICD-10-CM

## 2023-10-29 MED ORDER — PRAVASTATIN SODIUM 20 MG PO TABS: 20.0000 mg | ORAL_TABLET | ORAL | 1 refills | Status: DC

## 2023-10-29 NOTE — Progress Notes (Addendum)
   10/29/2023  Patient ID: Shannon [name], female   DOB: June 12, 1950, 73 y.o.   MRN: [id number]  Care Coordination Call  I called pharmacy to verify the previous prescription for quantity of 90 (180 days supply) of pravastatin  was discontinued and not put on hold  Dorcas Solian, PharmD Clinical Pharmacist Cell: [phone]

## 2023-11-05 ENCOUNTER — Other Ambulatory Visit: Payer: Self-pay | Admitting: Acute Care

## 2023-11-05 ENCOUNTER — Telehealth: Payer: Self-pay

## 2023-11-05 DIAGNOSIS — Z122 Encounter for screening for malignant neoplasm of respiratory organs: Secondary | ICD-10-CM

## 2023-11-05 DIAGNOSIS — Z87891 Personal history of nicotine dependence: Secondary | ICD-10-CM

## 2023-11-05 NOTE — Telephone Encounter (Signed)
 Patients's son has gout and will not be able to bring patient to have colonoscopy tomorrow.  Colonoscopy has been rescheduled from 11/06/23 to 12/27/23.  Instructions updated.  Referral updated.  Thanks,  Biggs, CMA

## 2023-11-14 NOTE — Progress Notes (Signed)
   11/14/2023  Patient ID: Shannon [name], female   DOB: [birth year], 73 y.o.   MRN: [id number]  This patient is appearing on a report for being at risk of failing the adherence measure for identified medications this calendar year.   Medication Adherence Summary (STAR/HEDIS Monitoring): Adherence Category: cholesterol (statin)    Drug Name: Pravastatin  20 mg every other day  Last Fill or Sold Date:10/31/2023 (sold) Days' Supply: 90 (qty 45)     Notes: ? Adherence data pulled from pharmacy claims portal Dr. Annemarie. ? Reviewed barriers to adherence: dispensing quantity incorrectly filled by pharmacy. ? Plan: Follow up for next fill in September.  Upcoming appointment with Pcp on 02/22/2024. May need an acceptable exclusion code for SUPD/SPC measure; as it stands patient is NOT in [facility] measure based on current medical history.   Dorcas Solian, PharmD Clinical Pharmacist Cell: [phone]

## 2023-11-23 ENCOUNTER — Ambulatory Visit
Admission: RE | Admit: 2023-11-23 | Discharge: 2023-11-23 | Disposition: A | Discharge status: Home / Self Care | Source: Ambulatory Visit | Attending: Acute Care | Admitting: Acute Care

## 2023-11-23 ENCOUNTER — Other Ambulatory Visit

## 2023-11-23 DIAGNOSIS — Z87891 Personal history of nicotine dependence: Secondary | ICD-10-CM

## 2023-11-23 DIAGNOSIS — Z122 Encounter for screening for malignant neoplasm of respiratory organs: Secondary | ICD-10-CM

## 2023-12-03 ENCOUNTER — Other Ambulatory Visit: Payer: Self-pay

## 2023-12-03 DIAGNOSIS — Z87891 Personal history of nicotine dependence: Secondary | ICD-10-CM

## 2023-12-03 DIAGNOSIS — Z122 Encounter for screening for malignant neoplasm of respiratory organs: Secondary | ICD-10-CM

## 2023-12-25 DIAGNOSIS — Z96643 Presence of artificial hip joint, bilateral: Secondary | ICD-10-CM | POA: Diagnosis not present

## 2023-12-27 ENCOUNTER — Other Ambulatory Visit: Payer: Self-pay

## 2023-12-27 ENCOUNTER — Ambulatory Visit
Admission: RE | Admit: 2023-12-27 | Discharge: 2023-12-27 | Disposition: A | Discharge status: Home / Self Care | Attending: Gastroenterology | Admitting: Gastroenterology

## 2023-12-27 ENCOUNTER — Encounter
Admission: RE | Disposition: A | Discharge status: Home / Self Care | Payer: Self-pay | Source: Home / Self Care | Attending: Gastroenterology

## 2023-12-27 ENCOUNTER — Ambulatory Visit: Admitting: Anesthesiology

## 2023-12-27 ENCOUNTER — Encounter: Payer: Self-pay | Admitting: Gastroenterology

## 2023-12-27 DIAGNOSIS — Z87891 Personal history of nicotine dependence: Secondary | ICD-10-CM | POA: Diagnosis not present

## 2023-12-27 DIAGNOSIS — K5730 Diverticulosis of large intestine without perforation or abscess without bleeding: Secondary | ICD-10-CM | POA: Insufficient documentation

## 2023-12-27 DIAGNOSIS — D125 Benign neoplasm of sigmoid colon: Secondary | ICD-10-CM | POA: Diagnosis not present

## 2023-12-27 DIAGNOSIS — J449 Chronic obstructive pulmonary disease, unspecified: Secondary | ICD-10-CM | POA: Insufficient documentation

## 2023-12-27 DIAGNOSIS — Z1211 Encounter for screening for malignant neoplasm of colon: Secondary | ICD-10-CM | POA: Diagnosis not present

## 2023-12-27 DIAGNOSIS — F32A Depression, unspecified: Secondary | ICD-10-CM | POA: Diagnosis not present

## 2023-12-27 DIAGNOSIS — F419 Anxiety disorder, unspecified: Secondary | ICD-10-CM | POA: Diagnosis not present

## 2023-12-27 DIAGNOSIS — K635 Polyp of colon: Secondary | ICD-10-CM | POA: Diagnosis not present

## 2023-12-27 DIAGNOSIS — K640 First degree hemorrhoids: Secondary | ICD-10-CM | POA: Insufficient documentation

## 2023-12-27 HISTORY — PX: POLYPECTOMY: SHX149

## 2023-12-27 HISTORY — PX: COLONOSCOPY: SHX5424

## 2023-12-27 SURGERY — COLONOSCOPY
Anesthesia: General

## 2023-12-27 MED ORDER — PROPOFOL 10 MG/ML IV BOLUS
INTRAVENOUS | Status: DC | PRN
  Administered 2023-12-27: 30 mg via INTRAVENOUS
  Administered 2023-12-27: 40 mg via INTRAVENOUS
  Administered 2023-12-27: 30 mg via INTRAVENOUS

## 2023-12-27 MED ORDER — PROPOFOL 500 MG/50ML IV EMUL
INTRAVENOUS | Status: DC | PRN
  Administered 2023-12-27: 75 ug/kg/min via INTRAVENOUS

## 2023-12-27 MED ORDER — PROPOFOL 1000 MG/100ML IV EMUL
INTRAVENOUS | Status: AC
  Filled 2023-12-27: qty 100

## 2023-12-27 MED ORDER — DEXMEDETOMIDINE HCL IN NACL 80 MCG/20ML IV SOLN
INTRAVENOUS | Status: DC | PRN
  Administered 2023-12-27: 8 ug via INTRAVENOUS
  Administered 2023-12-27: 12 ug via INTRAVENOUS

## 2023-12-27 MED ORDER — LIDOCAINE HCL (CARDIAC) PF 100 MG/5ML IV SOSY
PREFILLED_SYRINGE | INTRAVENOUS | Status: DC | PRN
  Administered 2023-12-27: 50 mg via INTRAVENOUS

## 2023-12-27 MED ORDER — LIDOCAINE HCL (PF) 2 % IJ SOLN
INTRAMUSCULAR | Status: AC
  Filled 2023-12-27: qty 5

## 2023-12-27 MED ORDER — DEXMEDETOMIDINE HCL IN NACL 80 MCG/20ML IV SOLN
INTRAVENOUS | Status: AC
Start: 2023-12-27 — End: 2023-12-27
  Filled 2023-12-27: qty 40

## 2023-12-27 MED ORDER — SODIUM CHLORIDE 0.9 % IV SOLN: INTRAVENOUS | Status: DC

## 2023-12-27 NOTE — Anesthesia Postprocedure Evaluation (Signed)
 Anesthesia Post Note  Patient: Shannon [name]  Procedure(s) Performed: COLONOSCOPY POLYPECTOMY, INTESTINE  Patient location during evaluation: Endoscopy Anesthesia Type: General Level of consciousness: awake and alert Pain management: pain level controlled Vital Signs Assessment: post-procedure vital signs reviewed and stable Respiratory status: spontaneous breathing, nonlabored ventilation, respiratory function stable and patient connected to nasal cannula oxygen Cardiovascular status: blood pressure returned to baseline and stable Postop Assessment: no apparent nausea or vomiting Anesthetic complications: no   No notable events documented.   Last Vitals:  Vitals:   12/27/23 1057 12/27/23 1118  BP: 102/69 100/67  Pulse: 76   Resp: 16   Temp: (!) 35.4 C   SpO2: 95%     Last Pain:  Vitals:   12/27/23 1118  TempSrc:   PainSc: 0-No pain                 Prentice Murphy

## 2023-12-27 NOTE — H&P (Signed)
 Shannon Copping, MD [facility] [address], [address] [place], [place] [address] Phone: [phone] Fax : [phone]  Primary Care Physician:  Shannon Fend, DO Primary Gastroenterologist:  Dr. Copping  Pre-Procedure History & Physical: HPI:  Shannon [name] is a 73 y.o. female is here for a screening colonoscopy.   Past Medical History:  Diagnosis Date   Allergic rhinitis    Anal fissure    child   Anxiety    Arthritis    Osteoarthritis mild DDD lumbar spine noted in 2018    COVID-19    05/2020   Headache    Hyperlipidemia    Stress    Trochanteric bursitis    Vitamin B12 deficiency    Vitamin D  deficiency     Past Surgical History:  Procedure Laterality Date   ANAL FISSURE REPAIR     CATARACT EXTRACTION     b/l 10 and 04/2018   CERVICAL CERCLAGE      X 3   JOINT REPLACEMENT     TOTAL HIP ARTHROPLASTY Right 08/24/2014   Dr. Mardee, [facility]   TOTAL HIP ARTHROPLASTY Left 09/25/2016   Procedure: TOTAL HIP ARTHROPLASTY;  Surgeon: Shannon Lynwood SQUIBB, MD;  Location: [facility] ORS;  Service: Orthopedics;  Laterality: Left;    Prior to Admission medications   Medication Sig Start Date End Date Taking? Authorizing Provider  Artificial Tear Solution (SOOTHE XP) SOLN Apply 1-2 drops to eye as needed (dry eye).   Yes [provider]  ASPIRIN 81 PO Take by mouth daily in the afternoon.   Yes [provider]  Cholecalciferol  25 MCG (1000 UT) capsule Take 1,000 Units by mouth at bedtime.   Yes [provider]  fexofenadine (ALLEGRA) 180 MG tablet Take 180 mg by mouth daily as needed for allergies or rhinitis.   Yes [provider]  fluticasone  (FLONASE ) 50 MCG/ACT nasal spray Place 2 sprays into both nostrils daily. 08/16/23  Yes Shannon Fend, DO  ibuprofen (ADVIL) 200 MG tablet Take 200 mg by mouth every 6 (six) hours as needed.   Yes [provider]  pravastatin  (PRAVACHOL ) 20 MG tablet Take 1 tablet (20 mg total) by mouth every other day.  After 6 pm 10/29/23  Yes Shannon Fend, DO  acetaminophen  (TYLENOL ) 500 MG tablet Take 500 mg by mouth every 6 (six) hours as needed for mild pain.    [provider]  albuterol  (VENTOLIN  HFA) 108 (90 Base) MCG/ACT inhaler Inhale 2 puffs into the lungs every 6 (six) hours as needed for wheezing or shortness of breath. 11/15/22   Shannon Fend, DO  buPROPion  ER (WELLBUTRIN  SR) 100 MG 12 hr tablet Take 1 tablet (100 mg total) by mouth 2 (two) times daily. 08/16/23   Shannon Fend, DO  cyanocobalamin  (VITAMIN B12) 500 MCG tablet Take 500 mcg by mouth daily.    [provider]  sodium chloride  (OCEAN) 0.65 % SOLN nasal spray Place 2 sprays into both nostrils as needed for congestion. Patient not taking: Reported on 09/07/2023 06/02/21   Shannon, [name] SAILOR, MD    Allergies as of 09/07/2023 - Review Complete 09/07/2023  Allergen Reaction Noted   Celexa  [citalopram ]  02/27/2020   Paxil [paroxetine]  12/13/2018   Zoloft [sertraline hcl] Itching 09/11/2016    Family History  Problem Relation Age of Onset   Osteoporosis Mother    Arthritis/Rheumatoid Mother    Hyperlipidemia Mother    Hypertension Father    Lung cancer Father  deid 60   Other Father        brain tumor   Hyperlipidemia Father    CAD Father        s/p cabg   Other Sister        brain tumor glioblastoma died as of May 24, 2019   Hypercalcemia Sister    Hyperlipidemia Sister    Gout Son    Other Son        cognitive d/o   Breast cancer Neg Hx     Social History   Socioeconomic History   Marital status: Divorced    Spouse name: Not on file   Number of children: Not on file   Years of education: Not on file   Highest education level: Some college, no degree  Occupational History   Not on file  Tobacco Use   Smoking status: Former    Current packs/day: 0.00    Average packs/day: 1.5 packs/day for 45.0 years (67.5 ttl pk-yrs)    Types: Cigarettes    Start date: 08/12/1969    Quit  date: 08/13/2014    Years since quitting: 9.3   Smokeless tobacco: Never  Vaping Use   Vaping status: Former  Substance and Sexual Activity   Alcohol  use: No   Drug use: No   Sexual activity: Not Currently  Other Topics Concern   Not on file  Social History Narrative   Lives with long term boyfriend 11 years older than her   1 son special needs lives in own place 89 y.o    3 kids total all sons      Meribeth Vitug son DPR      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   Social Drivers of [employer]: Low Risk  (12/25/2023)   Received from [facility]   Overall Financial Resource Strain (CARDIA)    Difficulty of Paying Living Expenses: Not hard at all  Food Insecurity: No Food Insecurity (12/25/2023)   [facility] from [facility]   Hunger Vital Sign    Within the past 12 months, you worried that your food would run out before you got the money to buy more.: Never true    Within the past 12 months, the food you bought just didn't last and you didn't have money to get more.: Never true  Transportation Needs: No Transportation Needs (12/25/2023)   Received from [facility] - Transportation    In the past 12 months, has lack of transportation kept you from medical appointments or from getting medications?: No    Lack of Transportation (Non-Medical): No  Physical Activity: Sufficiently Active (11/13/2022)   Exercise Vital Sign    Days of Exercise per Week: 5 days    Minutes of Exercise per Session: 30 min  Stress: No Stress Concern Present (11/13/2022)   [employer] of Occupational Health - Occupational Stress Questionnaire    Feeling of Stress : Only a little  Social Connections: Moderately Isolated (11/13/2022)   Social Connection and Isolation Panel    Frequency of Communication with Friends and Family: Once a week    Frequency of Social Gatherings with Friends and Family: Once a week    Attends Religious  Services: 1 to 4 times per year    Active Member of [employer] or Organizations: No    Attends Engineer, structural: Not on file    Marital Status: Living with partner  Intimate Partner Violence:  Not on file    Review of Systems: See HPI, otherwise negative ROS  Physical Exam: BP 131/74   Pulse 77   Temp (!) 96.6 F (35.9 C) (Temporal)   Resp 20   Ht 5' 4.49 (1.638 m)   Wt 83 kg   SpO2 99%   BMI 30.94 kg/m  General:   Alert,  pleasant and cooperative in NAD Head:  Normocephalic and atraumatic. Neck:  Supple; no masses or thyromegaly. Lungs:  Clear throughout to auscultation.    Heart:  Regular rate and rhythm. Abdomen:  Soft, nontender and nondistended. Normal bowel sounds, without guarding, and without rebound.   Neurologic:  Alert and  oriented x4;  grossly normal neurologically.  Impression/Plan: Shannon [name] is now here to undergo a screening colonoscopy.  Risks, benefits, and alternatives regarding colonoscopy have been reviewed with the patient.  Questions have been answered.  All parties agreeable.

## 2023-12-27 NOTE — Anesthesia Preprocedure Evaluation (Signed)
 Anesthesia Evaluation  Patient identified by MRN, date of birth, ID band Patient awake    Reviewed: Allergy & Precautions, H&P , NPO status , Patient's Chart, lab work & pertinent test results, reviewed documented beta blocker date and time   Airway Mallampati: I  TM Distance: >3 FB Neck ROM: full    Dental  (+) Dental Advidsory Given, Partial Upper, Caps, Missing   Pulmonary neg shortness of breath, COPD (mild), neg recent URI, former smoker   Pulmonary exam normal breath sounds clear to auscultation       Cardiovascular Exercise Tolerance: Good negative cardio ROS Normal cardiovascular exam Rhythm:regular Rate:Normal     Neuro/Psych  PSYCHIATRIC DISORDERS Anxiety Depression    negative neurological ROS     GI/Hepatic Neg liver ROS,GERD  ,,  Endo/Other  negative endocrine ROS    Renal/GU negative Renal ROS  negative genitourinary   Musculoskeletal   Abdominal   Peds  Hematology negative hematology ROS (+)   Anesthesia Other Findings Past Medical History: No date: Allergic rhinitis No date: Anal fissure     Comment:  child No date: Anxiety No date: Arthritis     Comment:  Osteoarthritis mild DDD lumbar spine noted in 2018  No date: COVID-19     Comment:  05/2020 No date: Headache No date: Hyperlipidemia No date: Stress No date: Trochanteric bursitis No date: Vitamin B12 deficiency No date: Vitamin D  deficiency   Reproductive/Obstetrics negative OB ROS                              Anesthesia Physical Anesthesia Plan  ASA: 2  Anesthesia Plan: General   Post-op Pain Management:    Induction: Intravenous  PONV Risk Score and Plan: 3 and Propofol  infusion, TIVA and Treatment may vary due to age or medical condition  Airway Management Planned: Natural Airway and Nasal Cannula  Additional Equipment:   Intra-op Plan:   Post-operative Plan:   Informed Consent: I have  reviewed the patients History and Physical, chart, labs and discussed the procedure including the risks, benefits and alternatives for the proposed anesthesia with the patient or authorized representative who has indicated his/her understanding and acceptance.     Dental Advisory Given  Plan Discussed with: Anesthesiologist, CRNA and Surgeon  Anesthesia Plan Comments:          Anesthesia Quick Evaluation

## 2023-12-27 NOTE — Transfer of Care (Signed)
 Immediate Anesthesia Transfer of Care Note  Patient: Shannon [name]  Procedure(s) Performed: COLONOSCOPY POLYPECTOMY, INTESTINE  Patient Location: PACU  Anesthesia Type:General  Level of Consciousness: sedated  Airway & Oxygen Therapy: Patient Spontanous Breathing  Post-op Assessment: Report given to RN and Post -op Vital signs reviewed and stable  Post vital signs: Reviewed and stable  Last Vitals:  Vitals Value Taken Time  BP    Temp    Pulse    Resp    SpO2      Last Pain:  Vitals:   12/27/23 0936  TempSrc: Temporal  PainSc: 0-No pain         Complications: No notable events documented.

## 2023-12-27 NOTE — Op Note (Signed)
 [facility] Gastroenterology Patient Name: Shannon [name] Procedure Date: 12/27/2023 10:28 AM MRN: [id number] Account #: [id number] Date of Birth: 04-12-1951 Admit Type: Outpatient Age: 73 Room: [facility] ENDO ROOM 4 Gender: Female Note Status: Finalized Instrument Name: Colon Scope [id number] Procedure:             Colonoscopy Indications:           Screening for colorectal malignant neoplasm Providers:             [name] MD, MD Referring MD:          Sharyle Fischer (Referring MD) Medicines:             Propofol  per Anesthesia Complications:         No immediate complications. Procedure:             Pre-Anesthesia Assessment:                        - Prior to the procedure, a History and Physical was                         performed, and patient medications and allergies were                         reviewed. The patient's tolerance of previous                         anesthesia was also reviewed. The risks and benefits                         of the procedure and the sedation options and risks                         were discussed with the patient. All questions were                         answered, and informed consent was obtained. Prior                         Anticoagulants: The patient has taken no anticoagulant                         or antiplatelet agents. ASA Grade Assessment: II - A                         patient with mild systemic disease. After reviewing                         the risks and benefits, the patient was deemed in                         satisfactory condition to undergo the procedure.                        After obtaining informed consent, the colonoscope was                         passed under direct vision. Throughout the procedure,  the patient's blood pressure, pulse, and oxygen                         saturations were monitored continuously. The                         Colonoscope was introduced through  the anus and                         advanced to the the cecum, identified by appendiceal                         orifice and ileocecal valve. The colonoscopy was                         performed without difficulty. The patient tolerated                         the procedure well. The quality of the bowel                         preparation was excellent. Findings:      The perianal and digital rectal examinations were normal.      Two sessile polyps were found in the sigmoid colon. The polyps were 2 to       3 mm in size. These polyps were removed with a cold snare. Resection and       retrieval were complete.      Multiple small-mouthed diverticula were found in the entire colon.      Non-bleeding internal hemorrhoids were found during retroflexion. The       hemorrhoids were Grade I (internal hemorrhoids that do not prolapse). Impression:            - Two 2 to 3 mm polyps in the sigmoid colon, removed                         with a cold snare. Resected and retrieved.                        - Diverticulosis in the sigmoid colon.                        - Non-bleeding internal hemorrhoids. Recommendation:        - Discharge patient to home.                        - Resume previous diet.                        - Continue present medications.                        - Await pathology results. Procedure Code(s):     --- Professional ---                        [phone], Colonoscopy, flexible; with removal of                         tumor(s), polyp(s), or other lesion(s) by snare  technique Diagnosis Code(s):     --- Professional ---                        Z12.11, Encounter for screening for malignant neoplasm                         of colon                        D12.5, Benign neoplasm of sigmoid colon CPT copyright 2022 American Medical Association. All rights reserved. The codes documented in this report are preliminary and upon coder review may  be revised to meet  current compliance requirements. [name] MD, MD 12/27/2023 10:56:29 AM This report has been signed electronically. Number of Addenda: 0 Note Initiated On: 12/27/2023 10:28 AM Scope Withdrawal Time: 0 hours 6 minutes 39 seconds  Total Procedure Duration: 0 hours 10 minutes 29 seconds  Estimated Blood Loss:  Estimated blood loss: none.      [facility]

## 2023-12-28 ENCOUNTER — Encounter: Payer: Self-pay | Admitting: Gastroenterology

## 2023-12-28 LAB — SURGICAL PATHOLOGY

## 2023-12-31 ENCOUNTER — Ambulatory Visit: Payer: Self-pay | Admitting: Gastroenterology

## 2024-02-22 ENCOUNTER — Encounter: Payer: Self-pay | Admitting: Internal Medicine

## 2024-02-22 ENCOUNTER — Ambulatory Visit: Admitting: Internal Medicine

## 2024-02-22 VITALS — BP 118/76 | HR 79 | Temp 97.9°F | Resp 18 | Ht 64.0 in | Wt 184.6 lb

## 2024-02-22 DIAGNOSIS — E782 Mixed hyperlipidemia: Secondary | ICD-10-CM

## 2024-02-22 DIAGNOSIS — Z23 Encounter for immunization: Secondary | ICD-10-CM | POA: Diagnosis not present

## 2024-02-22 DIAGNOSIS — F331 Major depressive disorder, recurrent, moderate: Secondary | ICD-10-CM

## 2024-02-22 MED ORDER — BUPROPION HCL ER (SR) 100 MG PO TB12: 100.0000 mg | ORAL_TABLET | Freq: Every day | ORAL | 1 refills | Status: AC

## 2024-02-22 MED ORDER — ROSUVASTATIN CALCIUM 10 MG PO TABS: 10.0000 mg | ORAL_TABLET | Freq: Every day | ORAL | 3 refills | Status: AC

## 2024-02-22 MED ORDER — COVID-19 MRNA VAC-TRIS(PFIZER) 30 MCG/0.3ML IM SUSY: 0.3000 mL | PREFILLED_SYRINGE | Freq: Once | INTRAMUSCULAR | 0 refills | Status: AC

## 2024-02-22 NOTE — Progress Notes (Signed)
 Established Patient Office Visit  Subjective   Patient ID: Shannon [name], female    DOB: 1951-02-14  Age: 73 y.o. MRN: [id number]  Chief Complaint  Patient presents with   Medical Management of Chronic Issues    6 month f/u    HPI  Shannon [name] presents for follow up on chronic medical conditions.   Discussed the use of AI scribe software for clinical note transcription with the patient, who gave verbal consent to proceed.  History of Present Illness Shannon [name] is a 73 year old female with hyperlipidemia who presents for medication management.  She takes pravastatin  every other day due to muscle aches. Her cholesterol levels were high in April. She is hesitant to switch to atorvastatin but is considering rosuvastatin based on her son's experience.  She takes Wellbutrin  once daily for depression, although it was prescribed twice daily. She feels generally well but experiences stress related to her son's living situation. She has a 90-day supply with one refill remaining.  She uses an inhaler occasionally in extreme weather conditions and has two refills available.  She received a pneumonia vaccine, her flu vaccine is current, and she had a COVID vaccine last year but not this year.   MDD: -Has a special needs son which is stressful and other difficult family dynamics - also her husband was just diagnosed with bladder cancer and started radiation which has been stressful -Mood status: stable  -Current treatment: Wellbutrin  back to 100 mg SR once daily -Had been on Wellbutrin  150 mg XL but had the occasional difficulty falling asleep -Failed Meds: Lexapro  caused dizziness -Symptom severity: moderate  Psychotherapy/counseling: yes, new, went once but isn't sure if she will go back because it was expensive  Previous psychiatric medications: celexa , paxil, prozac, wellbutrin , and zoloft, lexapro       02/22/2024    2:07 PM 08/16/2023    1:52 PM 05/18/2023    1:03 PM  11/15/2022   10:22 AM 10/11/2022   10:30 AM  Depression screen PHQ 2/9  Decreased Interest 0 1 1 0 1  Down, Depressed, Hopeless 0 1 1 1 1   PHQ - 2 Score 0 2 2 1 2   Altered sleeping 0 0 1 0   Tired, decreased energy 0 0 1 0   Change in appetite 0 0 1 0   Feeling bad or failure about yourself  0  0 0   Trouble concentrating 0 0 0 0   Moving slowly or fidgety/restless 0 0 0 0   Suicidal thoughts 0 0 0 0   PHQ-9 Score 0 2 5 1    Difficult doing work/chores Not difficult at all Not difficult at all Somewhat difficult Not difficult at all     HLD: -Medications: Pravastatin  20 mg every other day, aspirin  -Patient is compliant with above medications and reports no side effects.  -Last lipid panel: Lipid Panel     Component Value Date/Time   CHOL 225 (H) 08/16/2023 1421   CHOL 254 (H) 07/04/2019 1438   TRIG 89 08/16/2023 1421   HDL 73 08/16/2023 1421   HDL 70 07/04/2019 1438   CHOLHDL 3.1 08/16/2023 1421   VLDL 14.2 06/02/2021 1025   LDLCALC 133 (H) 08/16/2023 1421   LABVLDL 24 07/04/2019 1438    Allergies: -Currently on Flonase , had been on Allegra but does not take every day. -Symptoms exacerbated currently   COPD: -COPD status: stable - seen on lung CT -Current medications: Albuterol  PRN - only  had to use once or twice with exertion but did have to take yesterday  -Oxygen use: no -Dyspnea frequency: occasionally with walking her dog -Cough frequency: no -Rescue inhaler frequency: rarely  -Limitation of activity: no -Productive cough: no  Health Maintenance: -Blood work due -Mammogram 10/24 Birads-1 -Colonoscopy 2015, repeat in 2025, referral placed  -Lung cancer screening UTD 6/24  Patient Active Problem List   Diagnosis Date Noted   Encounter for screening colonoscopy 12/27/2023   Polyp of sigmoid colon 12/27/2023   Vitamin D  deficiency 08/16/2023   Pulmonary emphysema, unspecified emphysema type 05/18/2023   Moderate episode of recurrent major depressive disorder  (HCC) 05/18/2023   Stress incontinence 12/25/2022   Cystocele with third degree uterine prolapse 09/04/2022   Hand arthritis 06/02/2021   Vitamin B12 deficiency 01/25/2020   Chronic low back pain 01/05/2020   Depression, recurrent 01/05/2020   Obesity (BMI 30.0-34.9) 01/05/2020   Psychosocial stressors 01/05/2020   Elevated blood pressure reading 01/07/2019   Scoliosis of lumbar spine 01/07/2019   Left-sided low back pain with left-sided sciatica 01/07/2019   Hyperlipidemia 12/13/2018   Osteopenia 12/13/2018   Anxiety    Allergic rhinitis    Arthritis    Status post total replacement of hip 09/25/2016   Past Medical History:  Diagnosis Date   Allergic rhinitis    Anal fissure    child   Anxiety    Arthritis    Osteoarthritis mild DDD lumbar spine noted in 2018    COVID-19    05/2020   Headache    Hyperlipidemia    Stress    Trochanteric bursitis    Vitamin B12 deficiency    Vitamin D  deficiency    Past Surgical History:  Procedure Laterality Date   ANAL FISSURE REPAIR     CATARACT EXTRACTION     b/l 10 and 04/2018   CERVICAL CERCLAGE      X 3   COLONOSCOPY N/A 12/27/2023   Procedure: COLONOSCOPY;  Surgeon: Jinny Carmine, MD;  Location: [facility] ENDOSCOPY;  Service: Endoscopy;  Laterality: N/A;  patient would like arrival time after 9a   JOINT REPLACEMENT     POLYPECTOMY  12/27/2023   Procedure: POLYPECTOMY, INTESTINE;  Surgeon: Jinny Carmine, MD;  Location: [facility];  Service: Endoscopy;;   TOTAL HIP ARTHROPLASTY Right 08/24/2014   Dr. Mardee, [facility]   TOTAL HIP ARTHROPLASTY Left 09/25/2016   Procedure: TOTAL HIP ARTHROPLASTY;  Surgeon: Mardee Lynwood SQUIBB, MD;  Location: [facility] ORS;  Service: Orthopedics;  Laterality: Left;   Social History   Tobacco Use   Smoking status: Former    Current packs/day: 0.00    Average packs/day: 1.5 packs/day for 45.0 years (67.5 ttl pk-yrs)    Types: Cigarettes    Start date: 08/12/1969    Quit date: 08/13/2014    Years since  quitting: 9.5   Smokeless tobacco: Never  Vaping Use   Vaping status: Former  Substance Use Topics   Alcohol  use: No   Drug use: No   Social History   Socioeconomic History   Marital status: Divorced    Spouse name: Not on file   Number of children: Not on file   Years of education: Not on file   Highest education level: Some college, no degree  Occupational History   Not on file  Tobacco Use   Smoking status: Former    Current packs/day: 0.00    Average packs/day: 1.5 packs/day for 45.0 years (67.5 ttl pk-yrs)    Types: Cigarettes  Start date: 08/12/1969    Quit date: 08/13/2014    Years since quitting: 9.5   Smokeless tobacco: Never  Vaping Use   Vaping status: Former  Substance and Sexual Activity   Alcohol  use: No   Drug use: No   Sexual activity: Not Currently  Other Topics Concern   Not on file  Social History Narrative   Lives with long term boyfriend 11 years older than her   1 son special needs lives in own place 53 y.o    3 kids total all sons      Amel Kitch son DPR      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   Social Drivers of [employer]: Low Risk  (12/25/2023)   Received from [facility]   Overall Financial Resource Strain (CARDIA)    Difficulty of Paying Living Expenses: Not hard at all  Food Insecurity: No Food Insecurity (12/25/2023)   Received from [facility]   Hunger Vital Sign    Within the past 12 months, you worried that your food would run out before you got the money to buy more.: Never true    Within the past 12 months, the food you bought just didn't last and you didn't have money to get more.: Never true  Transportation Needs: No Transportation Needs (12/25/2023)   Received from [facility] - Transportation    In the past 12 months, has lack of transportation kept you from medical appointments or from getting medications?: No    Lack of Transportation  (Non-Medical): No  Physical Activity: Sufficiently Active (11/13/2022)   Exercise Vital Sign    Days of Exercise per Week: 5 days    Minutes of Exercise per Session: 30 min  Stress: No Stress Concern Present (11/13/2022)   [employer] of Occupational Health - Occupational Stress Questionnaire    Feeling of Stress : Only a little  Social Connections: Moderately Isolated (11/13/2022)   Social Connection and Isolation Panel    Frequency of Communication with Friends and Family: Once a week    Frequency of Social Gatherings with Friends and Family: Once a week    Attends Religious Services: 1 to 4 times per year    Active Member of [employer] or Organizations: No    Attends Engineer, structural: Not on file    Marital Status: Living with partner  Intimate Partner Violence: Not on file   Family Status  Relation Name Status   Mother  Deceased   Father  Deceased   Sister  Deceased   Sister  (Not Specified)   Son  Alive   Son  Alive   Son  Alive   Neg Hx  (Not Specified)  No partnership data on file   Family History  Problem Relation Age of Onset   Osteoporosis Mother    Arthritis/Rheumatoid Mother    Hyperlipidemia Mother    Hypertension Father    Lung cancer Father        deid 75   Other Father        brain tumor   Hyperlipidemia Father    CAD Father        s/p cabg   Other Sister        brain tumor glioblastoma died as of 2019-05-16   Hypercalcemia Sister    Hyperlipidemia Sister    Gout Son    Other Son  cognitive d/o   Breast cancer Neg Hx    Allergies  Allergen Reactions   Celexa  [Citalopram ]     H/a, sweating, brain fog   Paxil [Paroxetine]     Sweating increased     Zoloft [Sertraline Hcl] Itching      Review of Systems  All other systems reviewed and are negative.     Objective:     BP 118/76   Pulse 79   Temp 97.9 F (36.6 C)   Resp 18   Ht 5' 4 (1.626 m)   Wt 184 lb 9.6 oz (83.7 kg)   SpO2 95%   BMI 31.69 kg/m  BP Readings  from Last 3 Encounters:  02/22/24 118/76  12/27/23 100/67  08/16/23 130/72   Wt Readings from Last 3 Encounters:  02/22/24 184 lb 9.6 oz (83.7 kg)  12/27/23 183 lb (83 kg)  08/16/23 179 lb 14.4 oz (81.6 kg)      Physical Exam Constitutional:      Appearance: Normal appearance.  HENT:     Head: Normocephalic and atraumatic.  Eyes:     Conjunctiva/sclera: Conjunctivae normal.  Cardiovascular:     Rate and Rhythm: Normal rate and regular rhythm.  Pulmonary:     Effort: Pulmonary effort is normal.     Breath sounds: Normal breath sounds.  Skin:    General: Skin is warm and dry.  Neurological:     General: No focal deficit present.     Mental Status: She is alert. Mental status is at baseline.  Psychiatric:        Mood and Affect: Mood normal.        Behavior: Behavior normal.      No results found for any visits on 02/22/24.  Last CBC Lab Results  Component Value Date   WBC 7.2 08/16/2023   HGB 13.5 08/16/2023   HCT 41.1 08/16/2023   MCV 84.7 08/16/2023   MCH 27.8 08/16/2023   RDW 14.4 08/16/2023   PLT 282 08/16/2023   Last metabolic panel Lab Results  Component Value Date   GLUCOSE 87 08/16/2023   NA 140 08/16/2023   K 4.9 08/16/2023   CL 104 08/16/2023   CO2 28 08/16/2023   BUN 16 08/16/2023   CREATININE 0.81 08/16/2023   EGFR 58 (L) 07/06/2022   CALCIUM 9.9 08/16/2023   PROT 6.9 08/16/2023   ALBUMIN 4.2 06/02/2021   BILITOT 0.5 08/16/2023   ALKPHOS 99 06/02/2021   AST 25 08/16/2023   ALT 23 08/16/2023   ANIONGAP 6 09/27/2016   Last lipids Lab Results  Component Value Date   CHOL 225 (H) 08/16/2023   HDL 73 08/16/2023   LDLCALC 133 (H) 08/16/2023   TRIG 89 08/16/2023   CHOLHDL 3.1 08/16/2023   Last hemoglobin A1c No results found for: HGBA1C Last thyroid functions Lab Results  Component Value Date   TSH 0.62 06/02/2021   Last vitamin D  Lab Results  Component Value Date   VD25OH 38 08/16/2023   Last vitamin B12 and Folate Lab  Results  Component Value Date   VITAMINB12 1,225 (H) 08/16/2023      The 10-year ASCVD risk score (Arnett DK, et al., 2019) is: 9%    Assessment & Plan:   Assessment & Plan Hyperlipidemia Chronic hyperlipidemia inadequately controlled on pravastatin . Muscle aches with daily dosing necessitated alternate day dosing, compromising efficacy. - Discontinue pravastatin . - Start rosuvastatin 10 mg daily. - Reassess in six months with labs.  Depression Depression generally  well managed on bupropion . Prefers once daily dosing for adherence. - Continue bupropion  once daily. - Refill prescription with updated instructions.  General Health Maintenance COVID-19 vaccine last received over a year ago. Discussed benefits of receiving this year's vaccine. - [facility]  - rosuvastatin (CRESTOR) 10 MG tablet; Take 1 tablet (10 mg total) by mouth daily.  Dispense: 90 tablet; Refill: 3 - buPROPion  ER (WELLBUTRIN  SR) 100 MG 12 hr tablet; Take 1 tablet (100 mg total) by mouth daily.  Dispense: 90 tablet; Refill: 1 - Flu vaccine trivalent PF, 6mos and older(Flulaval,Afluria,Fluarix,Fluzone) - COVID-19 mRNA vaccine, Pfizer, (COMIRNATY) syringe; Inject 0.3 mLs into the muscle once for 1 dose.  Dispense: 0.3 mL; Refill: 0   Return in about 6 months (around 08/22/2024).    Sharyle Fischer, DO

## 2024-04-25 DIAGNOSIS — H524 Presbyopia: Secondary | ICD-10-CM | POA: Diagnosis not present

## 2024-04-25 DIAGNOSIS — Z0100 Encounter for examination of eyes and vision without abnormal findings: Secondary | ICD-10-CM | POA: Diagnosis not present

## 2024-05-27 ENCOUNTER — Other Ambulatory Visit: Payer: Self-pay | Admitting: Internal Medicine

## 2024-05-27 DIAGNOSIS — F331 Major depressive disorder, recurrent, moderate: Secondary | ICD-10-CM

## 2024-05-28 NOTE — Telephone Encounter (Signed)
 Too soon for refill, last refill 02/22/24 for 90 and 1 refill.  Requested Prescriptions  Pending Prescriptions Disp Refills   buPROPion  ER (WELLBUTRIN  SR) 100 MG 12 hr tablet [Pharmacy Med Name: BUPROPION  HCL SR 100 MG TABLET] 90 tablet 1    Sig: TAKE 1 TABLET BY MOUTH EVERY DAY     Psychiatry: Antidepressants - bupropion  Passed - 05/28/2024  3:15 PM      Passed - Cr in normal range and within 360 days    Creat  Date Value Ref Range Status  08/16/2023 0.81 0.60 - 1.00 mg/dL Final         Passed - AST in normal range and within 360 days    AST  Date Value Ref Range Status  08/16/2023 25 10 - 35 U/L Final         Passed - ALT in normal range and within 360 days    ALT  Date Value Ref Range Status  08/16/2023 23 6 - 29 U/L Final         Passed - Completed PHQ-2 or PHQ-9 in the last 360 days      Passed - Last BP in normal range    BP Readings from Last 1 Encounters:  02/22/24 118/76         Passed - Valid encounter within last 6 months    Recent Outpatient Visits           3 months ago Mixed hyperlipidemia   [facility] Bernardo Fend, DO   9 months ago Moderate episode of recurrent major depressive disorder [facility])   [facility] Bernardo Fend, [place]

## 2024-08-22 ENCOUNTER — Ambulatory Visit: Admitting: Internal Medicine

## 2024-08-26 ENCOUNTER — Ambulatory Visit: Admitting: Internal Medicine
